# Patient Record
Sex: Female | Born: 1940 | Race: White | Hispanic: No | Smoking: Never smoker
Health system: Southern US, Community
[De-identification: ages and names within clinical notes are randomized; demographics above are authoritative.]

## PROBLEM LIST (undated history)

## (undated) DIAGNOSIS — I639 Cerebral infarction, unspecified: Secondary | ICD-10-CM

## (undated) DIAGNOSIS — R519 Headache, unspecified: Secondary | ICD-10-CM

## (undated) DIAGNOSIS — I219 Acute myocardial infarction, unspecified: Secondary | ICD-10-CM

## (undated) DIAGNOSIS — M1990 Unspecified osteoarthritis, unspecified site: Secondary | ICD-10-CM

## (undated) DIAGNOSIS — I2510 Atherosclerotic heart disease of native coronary artery without angina pectoris: Secondary | ICD-10-CM

## (undated) DIAGNOSIS — E119 Type 2 diabetes mellitus without complications: Secondary | ICD-10-CM

## (undated) DIAGNOSIS — M1712 Unilateral primary osteoarthritis, left knee: Secondary | ICD-10-CM

## (undated) DIAGNOSIS — R51 Headache: Secondary | ICD-10-CM

## (undated) HISTORY — PX: BACK SURGERY: SHX140

## (undated) HISTORY — PX: WRIST SURGERY: SHX841

## (undated) HISTORY — PX: COLONOSCOPY: SHX5424

---

## 2009-03-09 ENCOUNTER — Encounter: Admission: RE | Admit: 2009-03-09 | Discharge: 2009-03-09 | Payer: Self-pay | Admitting: Neurosurgery

## 2011-03-29 ENCOUNTER — Other Ambulatory Visit: Payer: Self-pay | Admitting: Neurology

## 2011-03-29 DIAGNOSIS — S020XXA Fracture of vault of skull, initial encounter for closed fracture: Secondary | ICD-10-CM

## 2011-03-29 DIAGNOSIS — F0781 Postconcussional syndrome: Secondary | ICD-10-CM

## 2011-04-03 ENCOUNTER — Ambulatory Visit
Admission: RE | Admit: 2011-04-03 | Discharge: 2011-04-03 | Disposition: A | Discharge status: Home / Self Care | Payer: Medicare Other | Source: Ambulatory Visit | Attending: Neurology | Admitting: Neurology

## 2011-04-03 DIAGNOSIS — S020XXA Fracture of vault of skull, initial encounter for closed fracture: Secondary | ICD-10-CM

## 2011-04-03 DIAGNOSIS — F0781 Postconcussional syndrome: Secondary | ICD-10-CM

## 2011-04-03 MED ORDER — GADOBENATE DIMEGLUMINE 529 MG/ML IV SOLN
20.0000 mL | Freq: Once | INTRAVENOUS | Status: AC | PRN
  Administered 2011-04-03: 20 mL via INTRAVENOUS

## 2011-04-09 ENCOUNTER — Ambulatory Visit: Payer: Medicare Other | Attending: Neurology | Admitting: Rehabilitative and Restorative Service Providers"

## 2011-04-09 DIAGNOSIS — IMO0001 Reserved for inherently not codable concepts without codable children: Secondary | ICD-10-CM | POA: Insufficient documentation

## 2011-04-09 DIAGNOSIS — H8110 Benign paroxysmal vertigo, unspecified ear: Secondary | ICD-10-CM | POA: Insufficient documentation

## 2011-04-17 ENCOUNTER — Ambulatory Visit: Payer: Medicare Other | Admitting: Rehabilitative and Restorative Service Providers"

## 2011-04-20 ENCOUNTER — Encounter: Payer: Medicare Other | Admitting: Rehabilitative and Restorative Service Providers"

## 2011-04-24 ENCOUNTER — Encounter: Payer: Medicare Other | Admitting: Rehabilitative and Restorative Service Providers"

## 2011-04-25 ENCOUNTER — Ambulatory Visit: Payer: Medicare Other | Admitting: Physical Therapy

## 2011-04-27 ENCOUNTER — Encounter: Payer: Medicare Other | Admitting: Rehabilitative and Restorative Service Providers"

## 2011-05-02 ENCOUNTER — Encounter: Payer: Medicare Other | Admitting: Rehabilitative and Restorative Service Providers"

## 2011-05-04 ENCOUNTER — Encounter: Payer: Medicare Other | Admitting: Rehabilitative and Restorative Service Providers"

## 2011-05-07 ENCOUNTER — Ambulatory Visit: Payer: Medicare Other | Admitting: Physical Therapy

## 2011-05-11 ENCOUNTER — Ambulatory Visit: Payer: Medicare Other | Attending: Neurology | Admitting: Rehabilitative and Restorative Service Providers"

## 2011-05-11 DIAGNOSIS — IMO0001 Reserved for inherently not codable concepts without codable children: Secondary | ICD-10-CM | POA: Insufficient documentation

## 2011-05-11 DIAGNOSIS — H8110 Benign paroxysmal vertigo, unspecified ear: Secondary | ICD-10-CM | POA: Insufficient documentation

## 2014-01-08 HISTORY — PX: CARDIAC CATHETERIZATION: SHX172

## 2015-10-21 ENCOUNTER — Other Ambulatory Visit: Payer: Self-pay | Admitting: Orthopedic Surgery

## 2015-10-25 ENCOUNTER — Encounter (HOSPITAL_COMMUNITY)
Admission: RE | Admit: 2015-10-25 | Discharge: 2015-10-25 | Disposition: A | Discharge status: Home / Self Care | Payer: Medicare Other | Source: Ambulatory Visit | Attending: Orthopedic Surgery | Admitting: Orthopedic Surgery

## 2015-10-25 ENCOUNTER — Encounter (HOSPITAL_COMMUNITY): Payer: Self-pay | Admitting: *Deleted

## 2015-10-25 DIAGNOSIS — Z8673 Personal history of transient ischemic attack (TIA), and cerebral infarction without residual deficits: Secondary | ICD-10-CM | POA: Diagnosis not present

## 2015-10-25 DIAGNOSIS — Z01812 Encounter for preprocedural laboratory examination: Secondary | ICD-10-CM | POA: Diagnosis present

## 2015-10-25 DIAGNOSIS — E119 Type 2 diabetes mellitus without complications: Secondary | ICD-10-CM | POA: Diagnosis not present

## 2015-10-25 DIAGNOSIS — Z79899 Other long term (current) drug therapy: Secondary | ICD-10-CM | POA: Diagnosis not present

## 2015-10-25 DIAGNOSIS — I2510 Atherosclerotic heart disease of native coronary artery without angina pectoris: Secondary | ICD-10-CM | POA: Insufficient documentation

## 2015-10-25 DIAGNOSIS — Z7984 Long term (current) use of oral hypoglycemic drugs: Secondary | ICD-10-CM | POA: Insufficient documentation

## 2015-10-25 HISTORY — DX: Headache, unspecified: R51.9

## 2015-10-25 HISTORY — DX: Type 2 diabetes mellitus without complications: E11.9

## 2015-10-25 HISTORY — DX: Acute myocardial infarction, unspecified: I21.9

## 2015-10-25 HISTORY — DX: Unspecified osteoarthritis, unspecified site: M19.90

## 2015-10-25 HISTORY — DX: Headache: R51

## 2015-10-25 HISTORY — DX: Atherosclerotic heart disease of native coronary artery without angina pectoris: I25.10

## 2015-10-25 HISTORY — DX: Cerebral infarction, unspecified: I63.9

## 2015-10-25 LAB — BASIC METABOLIC PANEL
ANION GAP: 6 (ref 5–15)
BUN: 7 mg/dL (ref 6–20)
CHLORIDE: 106 mmol/L (ref 101–111)
CO2: 29 mmol/L (ref 22–32)
CREATININE: 0.73 mg/dL (ref 0.44–1.00)
Calcium: 9.1 mg/dL (ref 8.9–10.3)
GFR calc Af Amer: >60 mL/min (ref >60)
GFR calc non Af Amer: >60 mL/min (ref >60)
GLUCOSE: 206 mg/dL — AB (ref 65–99)
POTASSIUM: 3.6 mmol/L (ref 3.5–5.1)
Sodium: 141 mmol/L (ref 135–145)

## 2015-10-25 LAB — CBC
HCT: 37.8 % (ref 36.0–46.0)
Hemoglobin: 12.4 g/dL (ref 12.0–15.0)
MCH: 29.9 pg (ref 26.0–34.0)
MCHC: 32.8 g/dL (ref 30.0–36.0)
MCV: 91.1 fL (ref 78.0–100.0)
PLATELETS: 240 10*3/uL (ref 150–400)
RBC: 4.15 MIL/uL (ref 3.87–5.11)
RDW: 14.5 % (ref 11.5–15.5)
WBC: 6.3 10*3/uL (ref 4.0–10.5)

## 2015-10-25 LAB — SURGICAL PCR SCREEN
MRSA, PCR: NEGATIVE
STAPHYLOCOCCUS AUREUS: NEGATIVE

## 2015-10-25 LAB — GLUCOSE, CAPILLARY: GLUCOSE-CAPILLARY: 193 mg/dL — AB (ref 65–99)

## 2015-10-25 NOTE — Pre-Procedure Instructions (Signed)
Kimberly Miller  10/25/2015      [facility] [place], [place] - [address] [place] [address] Phone: [phone] Fax: [phone]    Your procedure is scheduled on Oct. 24  Report to [facility] Admitting at 850 A.M.  Call this number if you have problems the morning of surgery:  [phone]   Remember:  Do not eat food or drink liquids after midnight.  Take these medicines the morning of surgery with A SIP OF WATER tylenol if needed, carvedilol (Coreg)  Stop taking aspirin, BC's, Goody's, Herbal medications, Fish Oil, Vitamins, Ibuprofen, Advil, Motrin, Aleve Stop Plavix as directed by your Dr.    Yevonne Miller to Manage Your Diabetes Before and After Surgery  Why is it important to control my blood sugar before and after surgery? . Improving blood sugar levels before and after surgery helps healing and can limit problems. . A way of improving blood sugar control is eating a healthy diet by: o  Eating less sugar and carbohydrates o  Increasing activity/exercise o  Talking with your doctor about reaching your blood sugar goals . High blood sugars (greater than 180 mg/dL) can raise your risk of infections and slow your recovery, so you will need to focus on controlling your diabetes during the weeks before surgery. . Make sure that the doctor who takes care of your diabetes knows about your planned surgery including the date and location.  How do I manage my blood sugar before surgery? . Check your blood sugar at least 4 times a day, starting 2 days before surgery, to make sure that the level is not too high or low. o Check your blood sugar the morning of your surgery when you wake up and every 2 hours until you get to the Short Stay unit. . If your blood sugar is less than 70 mg/dL, you will need to treat for low blood sugar: o Do not take insulin. o Treat a low blood sugar (less than 70 mg/dL) with  cup of clear juice (cranberry or  apple), 4 glucose tablets, OR glucose gel. o Recheck blood sugar in 15 minutes after treatment (to make sure it is greater than 70 mg/dL). If your blood sugar is not greater than 70 mg/dL on recheck, call [phone] for further instructions. . Report your blood sugar to the short stay nurse when you get to Short Stay.  . If you are admitted to the hospital after surgery: o Your blood sugar will be checked by the staff and you will probably be given insulin after surgery (instead of oral diabetes medicines) to make sure you have good blood sugar levels. o The goal for blood sugar control after surgery is 80-180 mg/dL.      WHAT DO I DO ABOUT MY DIABETES MEDICATION?   Marland Kitchen Do not take oral diabetes medicines (pills) the morning of surgery. Metformin (Glucophage)  . THE NIGHT BEFORE SURGERY, take 15 units of  Lantus insulin.       Marland Kitchen HE MORNING OF SURGERY, take _____________ units of __________insulin.  . The day of surgery, do not take other diabetes injectables, including Byetta (exenatide), Bydureon (exenatide ER), Victoza (liraglutide), or Trulicity (dulaglutide).  . If your CBG is greater than 220 mg/dL, you may take  of your sliding scale (correction) dose of insulin.  Other Instructions:          Patient Signature:  Date:   Nurse Signature:  Date:  Reviewed and Endorsed by [facility] Patient Education Committee, August 2015  Do not wear jewelry, make-up or nail polish.  Do not wear lotions, powders, or perfumes, or deoderant.  Do not shave 48 hours prior to surgery.  Men may shave face and neck.  Do not bring valuables to the hospital.  [facility] is not responsible for any belongings or valuables.  Contacts, dentures or bridgework may not be worn into surgery.  Leave your suitcase in the car.  After surgery it may be brought to your room.  For patients admitted to the hospital, discharge time will be determined by your treatment team.  Patients discharged the day  of surgery will not be allowed to drive home.    Special instructions:  [facility] - Preparing for Surgery  Before surgery, you can play an important role.  Because skin is not sterile, your skin needs to be as free of germs as possible.  You can reduce the number of germs on you skin by washing with CHG (chlorahexidine gluconate) soap before surgery.  CHG is an antiseptic cleaner which kills germs and bonds with the skin to continue killing germs even after washing.  Please DO NOT use if you have an allergy to CHG or antibacterial soaps.  If your skin becomes reddened/irritated stop using the CHG and inform your nurse when you arrive at Short Stay.  Do not shave (including legs and underarms) for at least 48 hours prior to the first CHG shower.  You may shave your face.  Please follow these instructions carefully:   1.  Shower with CHG Soap the night before surgery and the    morning of Surgery.  2.  If you choose to wash your hair, wash your hair first as usual with your   normal shampoo.  3.  After you shampoo, rinse your hair and body thoroughly to remove the Shampoo.  4.  Use CHG as you would any other liquid soap.  You can apply chg directly  to the skin and wash gently with scrungie or a clean washcloth.  5.  Apply the CHG Soap to your body ONLY FROM THE NECK DOWN.        Do not use on open wounds or open sores.  Avoid contact with your eyes,       ears, mouth and genitals (private parts).  Wash genitals (private parts)  with your normal soap.  6.  Wash thoroughly, paying special attention to the area where your surgery   will be performed.  7.  Thoroughly rinse your body with warm water from the neck down.  8.  DO NOT shower/wash with your normal soap after using and rinsing off   the CHG Soap.  9.  Pat yourself dry with a clean towel.            10.  Wear clean pajamas.            11.  Place clean sheets on your bed the night of your first shower and do not sleep with pets.  Day of  Surgery  Do not apply any lotions/deoderants the morning of surgery.  Please wear clean clothes to the hospital/surgery center.     Please read over the following fact sheets that you were given. Pain Booklet, Coughing and Deep Breathing, MRSA Information and Surgical Site Infection Prevention

## 2015-10-25 NOTE — Progress Notes (Signed)
PCP is Dr. Salvatore DecentPaul Settle Cardiologist is Dr. Wilhemina BonitoSteven Davis [facility] Denies chest pain Request sent for EKG tracing to Dr Jinny Sandersavis's office. Pt request to go back to [facility] after surgery, Encouraged her to speak with the social worker about this- voices understanding.  Reports her fasting CBg's run 70-80's. Dr Shelba FlakeLandau's office called and asked when she should stop taking her plavix-message left with Sherri.

## 2015-10-25 NOTE — Progress Notes (Addendum)
Anesthesia Chart Review:  Pt is a 75 year old female scheduled for L unicompartmental knee on 11/01/2015 with Teryl LucyJoshua Landau, MD.   - Cardiologist [place] Wilhemina BonitoSteven Davis[place] - PCP is Salvatore DecentPaul Settle, MD.   PMH includes:  CAD (MI, stent to LAD 04/2014), stroke, DM. Never smoker.  Medications include: Carvedilol, Plavix, Lantus, lisinopril, metformin. As of the morning of 11/01/15, no one had addressed stopping plavix with pt.  Sherri in Dr. Shelba FlakeLandau's office to get input from cardiologist's office and notify pt when to stop plavix.   Preoperative labs reviewed.  Glucose 206. HbgA1c 6.9.  EKG requested from cardiologist's office.   Nuclear stress test 10/11/15: There is a large fixed apical defect. No ischemia. EF probably mildly reduced. Calculated EF is 54%. EF visually appears lower.  Dr. Earlene Plateravis' note dated 09/28/15 states if stress test is normal (see above), he will clear pt for surgery.   If no changes, I anticipate pt can proceed with surgery as scheduled.   Rica Mastngela Julann Mcgilvray, FNP-BC [facility]/Anesthesiology Phone: [phone] 10/26/2015 1:07 PM

## 2015-10-26 LAB — HEMOGLOBIN A1C
HEMOGLOBIN A1C: 6.9 % — AB (ref 4.8–5.6)
MEAN PLASMA GLUCOSE: 151 mg/dL

## 2015-11-01 ENCOUNTER — Inpatient Hospital Stay (HOSPITAL_COMMUNITY)
Admission: RE | Admit: 2015-11-01 | Discharge: 2015-11-04 | DRG: 470 | Disposition: A | Discharge status: Skilled Nursing Facility | Payer: Medicare Other | Source: Ambulatory Visit | Attending: Orthopedic Surgery | Admitting: Orthopedic Surgery

## 2015-11-01 ENCOUNTER — Inpatient Hospital Stay (HOSPITAL_COMMUNITY): Payer: Medicare Other | Admitting: Anesthesiology

## 2015-11-01 ENCOUNTER — Encounter (HOSPITAL_COMMUNITY)
Admission: RE | Disposition: A | Discharge status: Skilled Nursing Facility | Payer: Self-pay | Source: Ambulatory Visit | Attending: Orthopedic Surgery

## 2015-11-01 ENCOUNTER — Inpatient Hospital Stay (HOSPITAL_COMMUNITY): Payer: Medicare Other | Admitting: Emergency Medicine

## 2015-11-01 ENCOUNTER — Encounter (HOSPITAL_COMMUNITY): Payer: Self-pay | Admitting: *Deleted

## 2015-11-01 ENCOUNTER — Inpatient Hospital Stay (HOSPITAL_COMMUNITY): Payer: Medicare Other

## 2015-11-01 DIAGNOSIS — I10 Essential (primary) hypertension: Secondary | ICD-10-CM | POA: Diagnosis present

## 2015-11-01 DIAGNOSIS — M1712 Unilateral primary osteoarthritis, left knee: Principal | ICD-10-CM | POA: Diagnosis present

## 2015-11-01 DIAGNOSIS — Z7984 Long term (current) use of oral hypoglycemic drugs: Secondary | ICD-10-CM | POA: Diagnosis not present

## 2015-11-01 DIAGNOSIS — Z833 Family history of diabetes mellitus: Secondary | ICD-10-CM | POA: Diagnosis not present

## 2015-11-01 DIAGNOSIS — E119 Type 2 diabetes mellitus without complications: Secondary | ICD-10-CM | POA: Diagnosis present

## 2015-11-01 DIAGNOSIS — Z79899 Other long term (current) drug therapy: Secondary | ICD-10-CM | POA: Diagnosis not present

## 2015-11-01 DIAGNOSIS — Z886 Allergy status to analgesic agent status: Secondary | ICD-10-CM | POA: Diagnosis not present

## 2015-11-01 DIAGNOSIS — Z8673 Personal history of transient ischemic attack (TIA), and cerebral infarction without residual deficits: Secondary | ICD-10-CM

## 2015-11-01 DIAGNOSIS — Z888 Allergy status to other drugs, medicaments and biological substances status: Secondary | ICD-10-CM

## 2015-11-01 DIAGNOSIS — Z794 Long term (current) use of insulin: Secondary | ICD-10-CM | POA: Diagnosis not present

## 2015-11-01 DIAGNOSIS — I2510 Atherosclerotic heart disease of native coronary artery without angina pectoris: Secondary | ICD-10-CM | POA: Diagnosis present

## 2015-11-01 DIAGNOSIS — Z823 Family history of stroke: Secondary | ICD-10-CM | POA: Diagnosis not present

## 2015-11-01 DIAGNOSIS — Z96659 Presence of unspecified artificial knee joint: Secondary | ICD-10-CM

## 2015-11-01 DIAGNOSIS — Z7902 Long term (current) use of antithrombotics/antiplatelets: Secondary | ICD-10-CM | POA: Diagnosis not present

## 2015-11-01 HISTORY — PX: TOTAL KNEE ARTHROPLASTY: SHX125

## 2015-11-01 HISTORY — DX: Unilateral primary osteoarthritis, left knee: M17.12

## 2015-11-01 LAB — GLUCOSE, CAPILLARY
GLUCOSE-CAPILLARY: 120 mg/dL — AB (ref 65–99)
Glucose-Capillary: 148 mg/dL — ABNORMAL HIGH (ref 65–99)
Glucose-Capillary: 132 mg/dL — ABNORMAL HIGH (ref 65–99)

## 2015-11-01 SURGERY — ARTHROPLASTY, KNEE, TOTAL
Anesthesia: Regional | Site: Knee | Laterality: Left

## 2015-11-01 MED ORDER — MAGNESIUM CITRATE PO SOLN: 1.0000 | Freq: Once | ORAL | Status: DC | PRN

## 2015-11-01 MED ORDER — CARVEDILOL 12.5 MG PO TABS: 12.5000 mg | ORAL_TABLET | Freq: Every day | ORAL | Status: DC

## 2015-11-01 MED ORDER — PHENYLEPHRINE HCL 10 MG/ML IJ SOLN
INTRAMUSCULAR | Status: DC | PRN
  Administered 2015-11-01 (×3): 80 ug via INTRAVENOUS

## 2015-11-01 MED ORDER — CYANOCOBALAMIN 500 MCG PO TABS
250.0000 ug | ORAL_TABLET | Freq: Every day | ORAL | Status: DC
  Administered 2015-11-02 – 2015-11-03 (×2): 250 ug via ORAL
  Filled 2015-11-01 (×3): qty 1

## 2015-11-01 MED ORDER — LISINOPRIL 5 MG PO TABS
5.0000 mg | ORAL_TABLET | Freq: Every day | ORAL | Status: DC
  Administered 2015-11-02 – 2015-11-04 (×3): 5 mg via ORAL
  Filled 2015-11-01 (×3): qty 1

## 2015-11-01 MED ORDER — BUPIVACAINE HCL (PF) 0.25 % IJ SOLN
INTRAMUSCULAR | Status: DC | PRN
  Administered 2015-11-01: 20 mL

## 2015-11-01 MED ORDER — HYDROMORPHONE HCL 1 MG/ML IJ SOLN
INTRAMUSCULAR | Status: AC
  Filled 2015-11-01: qty 0.5

## 2015-11-01 MED ORDER — B-12 5000 MCG PO CAPS: 1.0000 | ORAL_CAPSULE | Freq: Every day | ORAL | Status: DC

## 2015-11-01 MED ORDER — LACTATED RINGERS IV SOLN
INTRAVENOUS | Status: DC
  Administered 2015-11-01 (×3): via INTRAVENOUS

## 2015-11-01 MED ORDER — METOCLOPRAMIDE HCL 5 MG/ML IJ SOLN: 5.0000 mg | Freq: Three times a day (TID) | INTRAMUSCULAR | Status: DC | PRN

## 2015-11-01 MED ORDER — SENNA 8.6 MG PO TABS
1.0000 | ORAL_TABLET | Freq: Two times a day (BID) | ORAL | Status: DC
  Administered 2015-11-01 – 2015-11-04 (×6): 8.6 mg via ORAL
  Filled 2015-11-01 (×5): qty 1

## 2015-11-01 MED ORDER — FENTANYL CITRATE (PF) 100 MCG/2ML IJ SOLN
INTRAMUSCULAR | Status: AC
  Filled 2015-11-01: qty 2

## 2015-11-01 MED ORDER — DOCUSATE SODIUM 100 MG PO CAPS
100.0000 mg | ORAL_CAPSULE | Freq: Two times a day (BID) | ORAL | Status: DC
  Administered 2015-11-01 – 2015-11-04 (×6): 100 mg via ORAL
  Filled 2015-11-01 (×6): qty 1

## 2015-11-01 MED ORDER — ACETAMINOPHEN 325 MG PO TABS
650.0000 mg | ORAL_TABLET | Freq: Four times a day (QID) | ORAL | Status: DC | PRN
  Administered 2015-11-03 (×2): 650 mg via ORAL
  Filled 2015-11-01 (×2): qty 2

## 2015-11-01 MED ORDER — ONDANSETRON HCL 4 MG/2ML IJ SOLN: 4.0000 mg | Freq: Four times a day (QID) | INTRAMUSCULAR | Status: DC | PRN

## 2015-11-01 MED ORDER — MIDAZOLAM HCL 5 MG/5ML IJ SOLN
INTRAMUSCULAR | Status: DC | PRN
Start: 2015-11-01 — End: 2015-11-01
  Administered 2015-11-01: 1 mg via INTRAVENOUS

## 2015-11-01 MED ORDER — MIDAZOLAM HCL 2 MG/2ML IJ SOLN
0.5000 mg | Freq: Once | INTRAMUSCULAR | Status: AC
  Administered 2015-11-01: 0.5 mg via INTRAVENOUS
  Filled 2015-11-01: qty 0.5

## 2015-11-01 MED ORDER — MIDAZOLAM HCL 2 MG/2ML IJ SOLN
INTRAMUSCULAR | Status: AC
  Filled 2015-11-01: qty 2

## 2015-11-01 MED ORDER — OXYCODONE HCL 5 MG PO TABS
ORAL_TABLET | ORAL | Status: AC
  Filled 2015-11-01: qty 1

## 2015-11-01 MED ORDER — RIVAROXABAN 10 MG PO TABS: 10.0000 mg | ORAL_TABLET | Freq: Every day | ORAL | 0 refills | Status: DC

## 2015-11-01 MED ORDER — PROPOFOL 10 MG/ML IV BOLUS
INTRAVENOUS | Status: DC | PRN
  Administered 2015-11-01: 20 mg via INTRAVENOUS
  Administered 2015-11-01: 100 mg via INTRAVENOUS

## 2015-11-01 MED ORDER — METHOCARBAMOL 500 MG PO TABS
500.0000 mg | ORAL_TABLET | Freq: Four times a day (QID) | ORAL | Status: DC | PRN
  Administered 2015-11-01 – 2015-11-02 (×3): 500 mg via ORAL
  Filled 2015-11-01 (×2): qty 1

## 2015-11-01 MED ORDER — HYDROMORPHONE HCL 2 MG/ML IJ SOLN
INTRAMUSCULAR | Status: AC
  Filled 2015-11-01: qty 1

## 2015-11-01 MED ORDER — SODIUM CHLORIDE 0.9 % IR SOLN
Status: DC | PRN
  Administered 2015-11-01: 3000 mL

## 2015-11-01 MED ORDER — DIPHENHYDRAMINE HCL 12.5 MG/5ML PO ELIX: 12.5000 mg | ORAL_SOLUTION | ORAL | Status: DC | PRN

## 2015-11-01 MED ORDER — ONDANSETRON HCL 4 MG PO TABS: 4.0000 mg | ORAL_TABLET | Freq: Four times a day (QID) | ORAL | Status: DC | PRN

## 2015-11-01 MED ORDER — PHENOL 1.4 % MT LIQD: 1.0000 | OROMUCOSAL | Status: DC | PRN

## 2015-11-01 MED ORDER — FENTANYL CITRATE (PF) 100 MCG/2ML IJ SOLN
25.0000 ug | Freq: Once | INTRAMUSCULAR | Status: AC
  Administered 2015-11-01: 25 ug via INTRAVENOUS
  Filled 2015-11-01: qty 0.5

## 2015-11-01 MED ORDER — BUPIVACAINE IN DEXTROSE 0.75-8.25 % IT SOLN
INTRATHECAL | Status: DC | PRN
  Administered 2015-11-01: 1.8 mL via INTRATHECAL

## 2015-11-01 MED ORDER — CARVEDILOL 12.5 MG PO TABS
12.5000 mg | ORAL_TABLET | Freq: Every day | ORAL | Status: DC
  Administered 2015-11-02 – 2015-11-04 (×3): 12.5 mg via ORAL
  Filled 2015-11-01 (×3): qty 1

## 2015-11-01 MED ORDER — OXYCODONE HCL 5 MG PO TABS
5.0000 mg | ORAL_TABLET | ORAL | Status: DC | PRN
  Administered 2015-11-01 (×2): 10 mg via ORAL
  Administered 2015-11-01: 5 mg via ORAL
  Administered 2015-11-02 – 2015-11-04 (×7): 10 mg via ORAL
  Filled 2015-11-01 (×11): qty 2

## 2015-11-01 MED ORDER — HYDROMORPHONE HCL 2 MG/ML IJ SOLN
0.5000 mg | INTRAMUSCULAR | Status: DC | PRN
  Administered 2015-11-01: 0.5 mg via INTRAVENOUS
  Filled 2015-11-01: qty 1

## 2015-11-01 MED ORDER — ONDANSETRON HCL 4 MG PO TABS: 4.0000 mg | ORAL_TABLET | Freq: Three times a day (TID) | ORAL | 0 refills | Status: DC | PRN

## 2015-11-01 MED ORDER — POLYETHYLENE GLYCOL 3350 17 G PO PACK: 17.0000 g | PACK | Freq: Every day | ORAL | Status: DC | PRN

## 2015-11-01 MED ORDER — INSULIN ASPART 100 UNIT/ML ~~LOC~~ SOLN
0.0000 [IU] | Freq: Three times a day (TID) | SUBCUTANEOUS | Status: DC
  Administered 2015-11-01: 2 [IU] via SUBCUTANEOUS
  Administered 2015-11-02: 3 [IU] via SUBCUTANEOUS
  Administered 2015-11-02: 8 [IU] via SUBCUTANEOUS
  Administered 2015-11-02: 3 [IU] via SUBCUTANEOUS
  Administered 2015-11-03 (×2): 2 [IU] via SUBCUTANEOUS
  Administered 2015-11-03: 3 [IU] via SUBCUTANEOUS
  Administered 2015-11-03: 8 [IU] via SUBCUTANEOUS
  Administered 2015-11-04: 2 [IU] via SUBCUTANEOUS
  Administered 2015-11-04: 3 [IU] via SUBCUTANEOUS

## 2015-11-01 MED ORDER — CARVEDILOL 12.5 MG PO TABS
12.5000 mg | ORAL_TABLET | Freq: Once | ORAL | Status: AC
  Administered 2015-11-01: 12.5 mg via ORAL
  Filled 2015-11-01: qty 1

## 2015-11-01 MED ORDER — METHOCARBAMOL 1000 MG/10ML IJ SOLN
500.0000 mg | Freq: Four times a day (QID) | INTRAVENOUS | Status: DC | PRN
  Filled 2015-11-01: qty 5

## 2015-11-01 MED ORDER — VITAMIN D 1000 UNITS PO TABS
5000.0000 [IU] | ORAL_TABLET | Freq: Every day | ORAL | Status: DC
  Administered 2015-11-01 – 2015-11-04 (×4): 5000 [IU] via ORAL
  Filled 2015-11-01 (×7): qty 5

## 2015-11-01 MED ORDER — POTASSIUM CHLORIDE IN NACL 20-0.45 MEQ/L-% IV SOLN
INTRAVENOUS | Status: DC
  Filled 2015-11-01 (×2): qty 1000

## 2015-11-01 MED ORDER — CEFAZOLIN SODIUM-DEXTROSE 2-4 GM/100ML-% IV SOLN
2.0000 g | INTRAVENOUS | Status: AC
  Administered 2015-11-01: 2 g via INTRAVENOUS
  Filled 2015-11-01: qty 100

## 2015-11-01 MED ORDER — SENNA-DOCUSATE SODIUM 8.6-50 MG PO TABS: 2.0000 | ORAL_TABLET | Freq: Every day | ORAL | 1 refills | Status: AC

## 2015-11-01 MED ORDER — INSULIN GLARGINE 100 UNIT/ML ~~LOC~~ SOLN
30.0000 [IU] | Freq: Every day | SUBCUTANEOUS | Status: DC
  Administered 2015-11-01 – 2015-11-03 (×3): 30 [IU] via SUBCUTANEOUS
  Filled 2015-11-01 (×4): qty 0.3

## 2015-11-01 MED ORDER — METHOCARBAMOL 500 MG PO TABS
ORAL_TABLET | ORAL | Status: AC
  Filled 2015-11-01: qty 1

## 2015-11-01 MED ORDER — HYDROMORPHONE HCL 1 MG/ML IJ SOLN
0.2500 mg | INTRAMUSCULAR | Status: DC | PRN
  Administered 2015-11-01 (×4): 0.5 mg via INTRAVENOUS

## 2015-11-01 MED ORDER — FENTANYL CITRATE (PF) 100 MCG/2ML IJ SOLN
INTRAMUSCULAR | Status: DC | PRN
  Administered 2015-11-01: 50 ug via INTRAVENOUS
  Administered 2015-11-01: 25 ug via INTRAVENOUS
  Administered 2015-11-01 (×2): 50 ug via INTRAVENOUS
  Administered 2015-11-01: 25 ug via INTRAVENOUS

## 2015-11-01 MED ORDER — ROPIVACAINE HCL 7.5 MG/ML IJ SOLN
INTRAMUSCULAR | Status: DC | PRN
  Administered 2015-11-01: 15 mL via PERINEURAL

## 2015-11-01 MED ORDER — DEXAMETHASONE SODIUM PHOSPHATE 10 MG/ML IJ SOLN
10.0000 mg | Freq: Once | INTRAMUSCULAR | Status: AC
  Administered 2015-11-02: 10 mg via INTRAVENOUS
  Filled 2015-11-01: qty 1

## 2015-11-01 MED ORDER — PROPOFOL 500 MG/50ML IV EMUL
INTRAVENOUS | Status: DC | PRN
  Administered 2015-11-01: 50 ug/kg/min via INTRAVENOUS

## 2015-11-01 MED ORDER — METOCLOPRAMIDE HCL 5 MG PO TABS: 5.0000 mg | ORAL_TABLET | Freq: Three times a day (TID) | ORAL | Status: DC | PRN

## 2015-11-01 MED ORDER — PROPOFOL 10 MG/ML IV BOLUS
INTRAVENOUS | Status: AC
Start: 2015-11-01 — End: 2015-11-01
  Filled 2015-11-01: qty 20

## 2015-11-01 MED ORDER — BISACODYL 10 MG RE SUPP: 10.0000 mg | Freq: Every day | RECTAL | Status: DC | PRN

## 2015-11-01 MED ORDER — CEFAZOLIN SODIUM-DEXTROSE 2-4 GM/100ML-% IV SOLN
2.0000 g | Freq: Four times a day (QID) | INTRAVENOUS | Status: AC
Start: 2015-11-01 — End: 2015-11-02
  Administered 2015-11-01 (×2): 2 g via INTRAVENOUS
  Filled 2015-11-01 (×3): qty 100

## 2015-11-01 MED ORDER — RIVAROXABAN 10 MG PO TABS
10.0000 mg | ORAL_TABLET | Freq: Every day | ORAL | Status: DC
  Administered 2015-11-02 – 2015-11-04 (×3): 10 mg via ORAL
  Filled 2015-11-01 (×3): qty 1

## 2015-11-01 MED ORDER — CARVEDILOL 12.5 MG PO TABS
ORAL_TABLET | ORAL | Status: AC
  Filled 2015-11-01: qty 1

## 2015-11-01 MED ORDER — OXYCODONE-ACETAMINOPHEN 5-325 MG PO TABS: 1.0000 | ORAL_TABLET | Freq: Four times a day (QID) | ORAL | 0 refills | Status: DC | PRN

## 2015-11-01 MED ORDER — GABAPENTIN 300 MG PO CAPS
900.0000 mg | ORAL_CAPSULE | Freq: Every day | ORAL | Status: DC
  Administered 2015-11-01 – 2015-11-03 (×3): 900 mg via ORAL
  Filled 2015-11-01 (×3): qty 3

## 2015-11-01 MED ORDER — MENTHOL 3 MG MT LOZG: 1.0000 | LOZENGE | OROMUCOSAL | Status: DC | PRN

## 2015-11-01 MED ORDER — ALUM & MAG HYDROXIDE-SIMETH 200-200-20 MG/5ML PO SUSP: 30.0000 mL | ORAL | Status: DC | PRN

## 2015-11-01 MED ORDER — 0.9 % SODIUM CHLORIDE (POUR BTL) OPTIME
TOPICAL | Status: DC | PRN
  Administered 2015-11-01: 1000 mL

## 2015-11-01 MED ORDER — ACETAMINOPHEN 650 MG RE SUPP: 650.0000 mg | Freq: Four times a day (QID) | RECTAL | Status: DC | PRN

## 2015-11-01 MED ORDER — BUPIVACAINE HCL (PF) 0.25 % IJ SOLN
INTRAMUSCULAR | Status: AC
  Filled 2015-11-01: qty 30

## 2015-11-01 MED ORDER — BACLOFEN 10 MG PO TABS
10.0000 mg | ORAL_TABLET | Freq: Three times a day (TID) | ORAL | 0 refills | Status: AC
Start: 2015-11-01 — End: ?

## 2015-11-01 MED ORDER — EPHEDRINE SULFATE 50 MG/ML IJ SOLN
INTRAMUSCULAR | Status: DC | PRN
  Administered 2015-11-01: 5 mg via INTRAVENOUS
  Administered 2015-11-01 (×2): 10 mg via INTRAVENOUS

## 2015-11-01 SURGICAL SUPPLY — 68 items
BANDAGE ACE 6X5 VEL STRL LF (GAUZE/BANDAGES/DRESSINGS) ×3 IMPLANT
BANDAGE ESMARK 6X9 LF (GAUZE/BANDAGES/DRESSINGS) ×2 IMPLANT
BLADE SAG 18X100X1.27 (BLADE) ×3 IMPLANT
BNDG ESMARK 6X9 LF (GAUZE/BANDAGES/DRESSINGS) ×3
BOWL SMART MIX CTS (DISPOSABLE) ×3 IMPLANT
CAPT KNEE TOTAL 3 ×3 IMPLANT
CEMENT HV SMART SET (Cement) ×6 IMPLANT
CLSR STERI-STRIP ANTIMIC 1/2X4 (GAUZE/BANDAGES/DRESSINGS) ×3 IMPLANT
COVER SURGICAL LIGHT HANDLE (MISCELLANEOUS) ×3 IMPLANT
CUFF TOURNIQUET SINGLE 34IN LL (TOURNIQUET CUFF) ×3 IMPLANT
DECANTER SPIKE VIAL GLASS SM (MISCELLANEOUS) ×3 IMPLANT
DRAPE EXTREMITY T 121X128X90 (DRAPE) ×3 IMPLANT
DRAPE IMP U-DRAPE 54X76 (DRAPES) ×3 IMPLANT
DRAPE ORTHO SPLIT 77X108 STRL (DRAPES)
DRAPE PROXIMA HALF (DRAPES) IMPLANT
DRAPE SURG ORHT 6 SPLT 77X108 (DRAPES) IMPLANT
DRAPE U-SHAPE 47X51 STRL (DRAPES) ×3 IMPLANT
DRSG PAD ABDOMINAL 8X10 ST (GAUZE/BANDAGES/DRESSINGS) ×3 IMPLANT
DURAPREP 26ML APPLICATOR (WOUND CARE) ×3 IMPLANT
ELECT CAUTERY BLADE 6.4 (BLADE) ×3 IMPLANT
ELECT REM PT RETURN 9FT ADLT (ELECTROSURGICAL) ×3
ELECTRODE REM PT RTRN 9FT ADLT (ELECTROSURGICAL) ×2 IMPLANT
GAUZE SPONGE 4X4 12PLY STRL (GAUZE/BANDAGES/DRESSINGS) ×3 IMPLANT
GLOVE BIO SURGEON STRL SZ7.5 (GLOVE) ×3 IMPLANT
GLOVE BIOGEL PI IND STRL 8 (GLOVE) ×2 IMPLANT
GLOVE BIOGEL PI INDICATOR 8 (GLOVE) ×1
GLOVE BIOGEL PI ORTHO PRO SZ8 (GLOVE) ×1
GLOVE PI ORTHO PRO STRL SZ8 (GLOVE) ×2 IMPLANT
GLOVE SURG ORTHO 8.0 STRL STRW (GLOVE) ×3 IMPLANT
GOWN STRL REUS W/ TWL LRG LVL3 (GOWN DISPOSABLE) ×2 IMPLANT
GOWN STRL REUS W/ TWL XL LVL3 (GOWN DISPOSABLE) ×2 IMPLANT
GOWN STRL REUS W/TWL 2XL LVL3 (GOWN DISPOSABLE) ×3 IMPLANT
GOWN STRL REUS W/TWL LRG LVL3 (GOWN DISPOSABLE) ×1
GOWN STRL REUS W/TWL XL LVL3 (GOWN DISPOSABLE) ×1
HANDPIECE INTERPULSE COAX TIP (DISPOSABLE) ×1
HOOD PEEL AWAY FACE SHEILD DIS (HOOD) ×9 IMPLANT
IMMOBILIZER KNEE 22 (SOFTGOODS) ×3 IMPLANT
IMMOBILIZER KNEE 22 UNIV (SOFTGOODS) ×3 IMPLANT
KIT BASIN OR (CUSTOM PROCEDURE TRAY) ×3 IMPLANT
KIT ROOM TURNOVER OR (KITS) ×3 IMPLANT
MANIFOLD NEPTUNE II (INSTRUMENTS) ×3 IMPLANT
NEEDLE 18GX1X1/2 (RX/OR ONLY) (NEEDLE) ×3 IMPLANT
NEEDLE HYPO 21X1.5 SAFETY (NEEDLE) IMPLANT
NS IRRIG 1000ML POUR BTL (IV SOLUTION) ×3 IMPLANT
PACK TOTAL JOINT (CUSTOM PROCEDURE TRAY) ×3 IMPLANT
PACK UNIVERSAL I (CUSTOM PROCEDURE TRAY) ×3 IMPLANT
PAD ABD 8X10 STRL (GAUZE/BANDAGES/DRESSINGS) ×3 IMPLANT
PAD ARMBOARD 7.5X6 YLW CONV (MISCELLANEOUS) ×6 IMPLANT
PAD CAST 4YDX4 CTTN HI CHSV (CAST SUPPLIES) ×2 IMPLANT
PADDING CAST COTTON 4X4 STRL (CAST SUPPLIES) ×1
PADDING CAST COTTON 6X4 STRL (CAST SUPPLIES) ×3 IMPLANT
SET HNDPC FAN SPRY TIP SCT (DISPOSABLE) ×2 IMPLANT
SUCTION FRAZIER HANDLE 10FR (MISCELLANEOUS) ×1
SUCTION FRAZIER TIP 10 FR DISP (SUCTIONS) ×3 IMPLANT
SUCTION TUBE FRAZIER 10FR DISP (MISCELLANEOUS) ×2 IMPLANT
SUT MNCRL AB 4-0 PS2 18 (SUTURE) IMPLANT
SUT VIC AB 0 CT1 27 (SUTURE) ×1
SUT VIC AB 0 CT1 27XBRD ANBCTR (SUTURE) ×2 IMPLANT
SUT VIC AB 1 CT1 27 (SUTURE) ×1
SUT VIC AB 1 CT1 27XBRD ANBCTR (SUTURE) ×2 IMPLANT
SUT VIC AB 2-0 CT1 27 (SUTURE) ×1
SUT VIC AB 2-0 CT1 TAPERPNT 27 (SUTURE) ×2 IMPLANT
SUT VIC AB 3-0 SH 8-18 (SUTURE) ×3 IMPLANT
SYR CONTROL 10ML LL (SYRINGE) ×3 IMPLANT
TOWEL OR 17X24 6PK STRL BLUE (TOWEL DISPOSABLE) ×3 IMPLANT
TOWEL OR 17X26 10 PK STRL BLUE (TOWEL DISPOSABLE) ×3 IMPLANT
TRAY CATH 16FR W/PLASTIC CATH (SET/KITS/TRAYS/PACK) ×3 IMPLANT
WATER STERILE IRR 1000ML POUR (IV SOLUTION) ×3 IMPLANT

## 2015-11-01 NOTE — H&P (Signed)
PREOPERATIVE H&P  Chief Complaint: DJD LEFT KNEE  HPI: Kimberly Miller is a 75 y.o. female who presents for preoperative history and physical with a diagnosis of DJD LEFT KNEE. Symptoms are rated as moderate to severe, and have been worsening.  This is significantly impairing activities of daily living.  She has elected for surgical management.   She has failed injections, activity modification, anti-inflammatories, and assistive devices.  Preoperative X-rays demonstrate end stage degenerative changes with osteophyte formation, loss of joint space, subchondral sclerosis. She is also failed anti-inflammatories as well as tramadol, the anti-inflammatories cause hives. She can't take prednisone either.   Past Medical History:  Diagnosis Date  . Arthritis   . Coronary artery disease   . Diabetes mellitus without complication (HCC)   . Headache   . Myocardial infarction   . Stroke [facility](HCC)    Past Surgical History:  Procedure Laterality Date  . CARDIAC CATHETERIZATION  2016  . COLONOSCOPY    . WRIST SURGERY Left    Social History   Social History  . Marital status: Unknown    Spouse name: N/A  . Number of children: N/A  . Years of education: N/A   Social History Main Topics  . Smoking status: Never Smoker  . Smokeless tobacco: Never Used  . Alcohol use No  . Drug use: No  . Sexual activity: Not Asked   Other Topics Concern  . None   Social History Narrative  . None   Family History  Problem Relation Age of Onset  . Stroke Mother   . Stroke Father   . Diabetes Brother    Allergies  Allergen Reactions  . Daypro [Oxaprozin] Swelling and Rash    Face, mouth, tongue swelling  . Ibuprofen Swelling and Rash    Allergic to ALL anti-inflammatory medications, including excedrine, aleve  . Naproxen Sodium Swelling and Rash   Prior to Admission medications   Medication Sig Start Date End Date Taking? Authorizing Provider  acetaminophen (TYLENOL) 650 MG CR tablet Take 650 mg  by mouth every 8 (eight) hours as needed for pain.   Yes Historical Provider, MD  carvedilol (COREG) 12.5 MG tablet Take 12.5 mg by mouth daily.   Yes Historical Provider, MD  Cholecalciferol ([employer] D3) 5000 units capsule Take 5,000 Units by mouth daily.   Yes Historical Provider, MD  clopidogrel (PLAVIX) 75 MG tablet Take 75 mg by mouth daily.   Yes Historical Provider, MD  Cyanocobalamin (B-12) 5000 MCG CAPS Take 1 tablet by mouth daily.    Yes Historical Provider, MD  gabapentin (NEURONTIN) 300 MG capsule Take 900 mg by mouth at bedtime.   Yes Historical Provider, MD  Grape Seed Extract 100 MG CAPS Take 100 mg by mouth daily.   Yes Historical Provider, MD  insulin glargine (LANTUS) 100 UNIT/ML injection Inject 30 Units into the skin at bedtime.   Yes Historical Provider, MD  lisinopril (PRINIVIL,ZESTRIL) 5 MG tablet Take 5 mg by mouth daily.   Yes Historical Provider, MD  metFORMIN (GLUCOPHAGE) 500 MG tablet Take 500 mg by mouth every evening.   Yes Historical Provider, MD  OVER THE COUNTER MEDICATION Take 1 tablet by mouth daily. B Complex vitamin with Vitamin C and folic acid   Yes Historical Provider, MD     Positive ROS: All other systems have been reviewed and were otherwise negative with the exception of those mentioned in the HPI and as above.  Physical Exam: General: Alert, no acute distress Cardiovascular: No pedal  edema Respiratory: No cyanosis, no use of accessory musculature GI: No organomegaly, abdomen is soft and non-tender Skin: No lesions in the area of chief complaint Neurologic: Sensation intact distally Psychiatric: Patient is competent for consent with normal mood and affect Lymphatic: No axillary or cervical lymphadenopathy  MUSCULOSKELETAL: Left knee has a painful arc of motion from 0-115, negative Lachman, medial joint line tenderness, extensor mechanism intact.  Assessment: DJD LEFT KNEE   Plan: Plan for Procedure(s): UNICOMPARTMENTAL KNEE versus total knee  replacement  The risks benefits and alternatives were discussed with the patient including but not limited to the risks of nonoperative treatment, versus surgical intervention including infection, bleeding, nerve injury,  blood clots, cardiopulmonary complications, morbidity, mortality, among others, and they were willing to proceed. We will assess the patellofemoral and lateral compartments intraoperatively to determine if she is in fact a partial knee versus total knee replacement.  Eulas Post, MD Cell [phone]   11/01/2015 10:36 AM

## 2015-11-01 NOTE — OR Nursing (Signed)
1455: in&out cath=300cc cyu, per protocol

## 2015-11-01 NOTE — Anesthesia Preprocedure Evaluation (Addendum)
Anesthesia Evaluation  Patient identified by MRN, date of birth, ID band Patient awake    Reviewed: Allergy & Precautions, NPO status , Patient's Chart, lab work & pertinent test results  Airway Mallampati: II  TM Distance: >3 FB Neck ROM: Full    Dental  (+) Dental Advisory Given   Pulmonary neg pulmonary ROS,    breath sounds clear to auscultation       Cardiovascular hypertension, Pt. on medications and Pt. on home beta blockers + CAD, + Past MI and + Cardiac Stents   Rhythm:Regular Rate:Normal     Neuro/Psych CVA    GI/Hepatic negative GI ROS, Neg liver ROS,   Endo/Other  diabetes, Type 2, Insulin Dependent  Renal/GU negative Renal ROS     Musculoskeletal  (+) Arthritis ,   Abdominal   Peds  Hematology On plavix last dose >7days ago.   Anesthesia Other Findings   Reproductive/Obstetrics                            Lab Results  Component Value Date   WBC 6.3 10/25/2015   HGB 12.4 10/25/2015   HCT 37.8 10/25/2015   MCV 91.1 10/25/2015   PLT 240 10/25/2015   Lab Results  Component Value Date   CREATININE 0.73 10/25/2015   BUN 7 10/25/2015   NA 141 10/25/2015   K 3.6 10/25/2015   CL 106 10/25/2015   CO2 29 10/25/2015   No results found for: INR, PROTIME  Anesthesia Physical Anesthesia Plan  ASA: III  Anesthesia Plan: MAC, Regional and Spinal   Post-op Pain Management:  Regional for Post-op pain   Induction: Intravenous  Airway Management Planned: Natural Airway and Simple Face Mask  Additional Equipment:   Intra-op Plan:   Post-operative Plan:   Informed Consent: I have reviewed the patients History and Physical, chart, labs and discussed the procedure including the risks, benefits and alternatives for the proposed anesthesia with the patient or authorized representative who has indicated his/her understanding and acceptance.     Plan Discussed with:  CRNA  Anesthesia Plan Comments:        Anesthesia Quick Evaluation

## 2015-11-01 NOTE — Anesthesia Procedure Notes (Signed)
Procedure Name: LMA Insertion Date/Time: 11/01/2015 12:15 PM Performed by: Donette LarryMEYER, Shonice Wrisley E Pre-anesthesia Checklist: Patient identified, Emergency Drugs available, Suction available and Patient being monitored Patient Re-evaluated:Patient Re-evaluated prior to inductionOxygen Delivery Method: Circle System Utilized Preoxygenation: Pre-oxygenation with 100% oxygen Intubation Type: IV induction Ventilation: Mask ventilation without difficulty LMA: LMA inserted LMA Size: 4.0 Number of attempts: 1 Placement Confirmation: positive ETCO2 Tube secured with: Tape Dental Injury: Teeth and Oropharynx as per pre-operative assessment

## 2015-11-01 NOTE — Anesthesia Procedure Notes (Signed)
Spinal  Staffing Anesthesiologist: Marcene DuosFITZGERALD, Noa Galvao Performed: anesthesiologist  Preanesthetic Checklist Completed: patient identified, site marked, surgical consent, pre-op evaluation, timeout performed, IV checked, risks and benefits discussed and monitors and equipment checked Spinal Block Patient position: sitting Prep: site prepped and draped and DuraPrep Patient monitoring: heart rate, continuous pulse ox and blood pressure Approach: midline Location: L3-4 Injection technique: single-shot Needle Needle type: Pencan  Needle gauge: 24 G Needle length: 9 cm Assessment Events: failed spinal Additional Notes Multiple attempts at L4-5, L5-S1, with successful flow of CSF at L3-4 interspace. Unable to aspirate so needle rotated 90 degrees x3 without successful aspiration. Syringe taken off and CSF no longer free flowing. Spinal needle withdrawn slightly and CSF free flowing again. Unable to aspirate with syringe. Needle rotated 90 degreees and positive aspiration before injection. Upon completion of injection the syringe slipped off and approximately the last .2ml sprayed onto field and needle withdrawn. Pt prepped and draped and inadequate block noted. Converted to GA with LMA.

## 2015-11-01 NOTE — Anesthesia Postprocedure Evaluation (Signed)
Anesthesia Post Note  Patient: Kimberly Miller  Procedure(s) Performed: Procedure(s) (LRB): TOTAL KNEE ARTHROPLASTY (Left)  Patient location during evaluation: PACU Anesthesia Type: Spinal and MAC Level of consciousness: awake and alert Pain management: pain level controlled Vital Signs Assessment: post-procedure vital signs reviewed and stable Respiratory status: spontaneous breathing and respiratory function stable Cardiovascular status: blood pressure returned to baseline and stable Postop Assessment: spinal receding Anesthetic complications: no    Kimberly Miller

## 2015-11-01 NOTE — Anesthesia Procedure Notes (Signed)
Anesthesia Regional Block:  Adductor canal block  Pre-Anesthetic Checklist: ,, timeout performed, Correct Patient, Correct Site, Correct Laterality, Correct Procedure, Correct Position, site marked, Risks and benefits discussed,  Surgical consent,  Pre-op evaluation,  At surgeon's request and post-op pain management  Laterality: Left  Prep: chloraprep       Needles:  Injection technique: Single-shot  Needle Type: Echogenic Needle     Needle Length: 9cm 9 cm Needle Gauge: 21 and 21 G    Additional Needles:  Procedures: ultrasound guided (picture in chart) Adductor canal block Narrative:  Start time: 11/01/2015 10:07 AM End time: 11/01/2015 10:14 AM Injection made incrementally with aspirations every 5 mL.  Performed by: Personally  Anesthesiologist: Marcene DuosFITZGERALD, Rikita Grabert

## 2015-11-01 NOTE — Transfer of Care (Signed)
Immediate Anesthesia Transfer of Care Note  Patient: Kimberly Miller  Procedure(s) Performed: Procedure(s): TOTAL KNEE ARTHROPLASTY (Left)  Patient Location: PACU  Anesthesia Type:GA combined with regional for post-op pain  Level of Consciousness: awake and alert   Airway & Oxygen Therapy: Patient Spontanous Breathing and Patient connected to nasal cannula oxygen  Post-op Assessment: Report given to RN, Post -op Vital signs reviewed and stable and Patient moving all extremities X 4  Post vital signs: Reviewed and stable  Last Vitals:  Vitals:   11/01/15 1045 11/01/15 1510  BP:    Pulse: 72   Resp: 10   Temp:  36.1 C   HR 75, BP 146/86, RR 16, sats 94% Last Pain:  Vitals:   11/01/15 0929  TempSrc: Oral  PainSc:       Patients Stated Pain Goal: 2 (11/01/15 [id number])  Complications: No apparent anesthesia complications

## 2015-11-01 NOTE — Op Note (Signed)
DATE OF SURGERY:  11/01/2015 TIME: 2:24 PM  PATIENT NAME:  Kimberly Miller   AGE: 75 y.o.    PRE-OPERATIVE DIAGNOSIS:  Left knee osteoarthritis  POST-OPERATIVE DIAGNOSIS:  Same  PROCEDURE:  Procedure(s): Left TOTAL KNEE ARTHROPLASTY   SURGEON:  Eulas PostLANDAU,Emmamae Mcnamara P, MD   ASSISTANT:  Janace LittenBrandon Parry, OPA-C, present and scrubbed throughout the case, critical for assistance with exposure, retraction, instrumentation, and closure.   OPERATIVE IMPLANTS: Biomet Vanguard size 71 tibia, 75 femur, 10 mm polyethylene insert, 37 mm patella. I used a posterior stabilized fixed-bearing system with patellar lug fixation.   PREOPERATIVE INDICATIONS:  Kimberly Miller is a 75 y.o. year old female with end stage bone on bone degenerative arthritis of the knee who failed conservative treatment, including injections, antiinflammatories, activity modification, and assistive devices, and had significant impairment of their activities of daily living, and elected for Total Knee Arthroplasty.   The risks, benefits, and alternatives were discussed at length including but not limited to the risks of infection, bleeding, nerve injury, stiffness, blood clots, the need for revision surgery, cardiopulmonary complications, among others, and they were willing to proceed.  OPERATIVE FINDINGS AND UNIQUE ASPECTS OF THE CASE:  Her bone quality was extremely poor. The patellofemoral joint had end-stage grade 4 changes on the lateral femoral condyle as well as the lateral facet of the patella, precluding partial knee replacement. The medial compartment was completely eburnated as well. The lateral compartment was still moderately spared, but she also had a 10 flexion contracture.  I had to cut the tibia twice and also the femur twice. She was between a 71 and a 75 on the tibia, but I went with the 71 in order to position the component with the appropriate rotation, and she was flushed from front to back, although she had a posterior  medial exposed tibial bone, but the medial tibial condyle was still fairly sclerotic, and I did not want to weaken that by removing any of the plateau. The patella tracked well at completion of the case.  OPERATIVE DESCRIPTION:  The patient was brought to the operative room and placed in a supine position.  General anesthesia was administered.  Initially spinal was attempted but was inadequate.  IV antibiotics were given.  The lower extremity was prepped and draped in the usual sterile fashion.  Time out was performed.  The leg was elevated and exsanguinated and the tourniquet was inflated.  Anterior quadriceps tendon splitting approach was performed.  I was considering if she was a partial knee candidate, I had her positioned in the leg holder, and after exposure of the patellofemoral joint I abandoned the partial knee plan and converted to a total knee replacement, which we had already prepared for and discussed with the patient.   The patella was partially everted and osteophytes were removed.  During the majority of the case I did not in fact everted patella but rather slid laterally around the condyle. The anterior horn of the medial and lateral meniscus was removed.   The distal femur was opened with the drill and the intramedullary distal femoral cutting jig was utilized, set at 5 degrees resecting 10 mm off the distal femur.  Care was taken to protect the collateral ligaments.  Then the extramedullary tibial cutting jig was utilized making the appropriate cut using the anterior tibial crest as a reference building in appropriate posterior slope.  Care was taken during the cut to protect the medial and collateral ligaments.  The proximal tibia was removed  along with the posterior horns of the menisci.  The PCL was sacrificed.    The extensor gap was measured and was still too tight, and so I removed an additional 2 mm off the tibia and the femur, resulting in a gap that measured approximately  10mm.  the bone quality was extremely poor.  The distal femoral sizing jig was applied, taking care to avoid notching.  Then the 4-in-1 cutting jig was applied and the anterior and posterior femur was cut, along with the chamfer cuts.  All posterior osteophytes were removed.  The flexion gap was then measured and was symmetric with the extension gap.  I completed the distal femoral preparation using the appropriate jig to prepare the box.  The patella was then measured, and cut with the saw.  The thickness before the cut was 20 at the thickest, although it was fairly thin on the lateral patellar facet from chronic wear, and after the cut was 13.  The proximal tibia sized and prepared accordingly with the reamer and the punch, and then all components were trialed with the 10mm poly insert.  The knee was found to have excellent balance and full motion.    The above named components were then cemented into place and all excess cement was removed.  The real polyethylene implant was placed.  After the cement had cured I released the tourniquet and confirmed excellent hemostasis with no major posterior vessel injury.    The knee was easily taken through a range of motion and the patella tracked well and the knee irrigated copiously and the parapatellar and subcutaneous tissue closed with vicryl, and monocryl with steri strips for the skin.  The wounds were injected with marcaine, and dressed with sterile gauze and the patient was awakened and returned to the PACU in stable and satisfactory condition.  There were no complications.  Total tourniquet time was 110 minutes.

## 2015-11-02 ENCOUNTER — Encounter (HOSPITAL_COMMUNITY): Payer: Self-pay | Admitting: Orthopedic Surgery

## 2015-11-02 LAB — GLUCOSE, CAPILLARY
GLUCOSE-CAPILLARY: 159 mg/dL — AB (ref 65–99)
GLUCOSE-CAPILLARY: 169 mg/dL — AB (ref 65–99)
Glucose-Capillary: 279 mg/dL — ABNORMAL HIGH (ref 65–99)
Glucose-Capillary: 247 mg/dL — ABNORMAL HIGH (ref 65–99)

## 2015-11-02 LAB — BASIC METABOLIC PANEL
ANION GAP: 10 (ref 5–15)
BUN: 7 mg/dL (ref 6–20)
CO2: 24 mmol/L (ref 22–32)
Calcium: 8.5 mg/dL — ABNORMAL LOW (ref 8.9–10.3)
Chloride: 101 mmol/L (ref 101–111)
Creatinine, Ser: 0.7 mg/dL (ref 0.44–1.00)
GFR calc Af Amer: >60 mL/min (ref >60)
GLUCOSE: 162 mg/dL — AB (ref 65–99)
POTASSIUM: 3.7 mmol/L (ref 3.5–5.1)
Sodium: 135 mmol/L (ref 135–145)

## 2015-11-02 LAB — CBC
HEMATOCRIT: 33.6 % — AB (ref 36.0–46.0)
Hemoglobin: 10.9 g/dL — ABNORMAL LOW (ref 12.0–15.0)
MCH: 29.9 pg (ref 26.0–34.0)
MCHC: 32.4 g/dL (ref 30.0–36.0)
MCV: 92.1 fL (ref 78.0–100.0)
PLATELETS: 227 10*3/uL (ref 150–400)
RBC: 3.65 MIL/uL — AB (ref 3.87–5.11)
RDW: 14.4 % (ref 11.5–15.5)
WBC: 8.6 10*3/uL (ref 4.0–10.5)

## 2015-11-02 NOTE — Evaluation (Addendum)
Physical Therapy Evaluation Patient Details Name: Kimberly Miller MRN: [id number] DOB: 07/01/1940 Today's Date: 11/02/2015   History of Present Illness  Pt is a 75 y.o. female now s/p Lt THA. PMH: stroke, MI, dieabetes, CAD.   Clinical Impression  Pt is s/p TKA resulting in the deficits listed below (see PT Problem List). Pt unable to safely attempt ambulation, able to perform sit/stand transfer with max assist. Pt will benefit from skilled PT to increase their independence and safety with mobility to allow discharge to SNF for further rehabilitation following acute stay.       Follow Up Recommendations SNF;Supervision for mobility/OOB    Equipment Recommendations   (to be addressed at next venue)    Recommendations for Other Services       Precautions / Restrictions Precautions Precautions: Knee;Fall Precaution Booklet Issued: Yes (comment) Precaution Comments: HEP provided and knee extension precautions reviewed Restrictions Weight Bearing Restrictions: Yes LLE Weight Bearing: Weight bearing as tolerated      Mobility  Bed Mobility Overal bed mobility: Needs Assistance Bed Mobility: Supine to Sit     Supine to sit: HOB elevated;Mod assist     General bed mobility comments: pt using rail and HOB elevated, assist provided with LLE to come to sitting EOB.   Transfers Overall transfer level: Needs assistance Equipment used: Rolling walker (2 wheeled) Transfers: Sit to/from [employer] Sit to Stand: Max assist;From elevated surface Stand pivot transfers: Mod assist       General transfer comment: Pt requiring 2 attempts to stand and repeated cues needed for hand position. Once standing pt needing verbal and tactile cues for posture. For stand-pivot, pt wiggling feet, poor ability to unweight extremities to take steps. Chair brought up from behind to fully square up to chair prior to sitting. Pt with 3 losses of balance during session with assist to  recover.   Ambulation/Gait             General Gait Details: unable to safely attempt  Stairs            Wheelchair Mobility    Modified Rankin (Stroke Patients Only)       Balance Overall balance assessment: Needs assistance Sitting-balance support: Single extremity supported Sitting balance-Leahy Scale: Fair     Standing balance support: Bilateral upper extremity supported Standing balance-Leahy Scale: Poor Standing balance comment: using rw and multiple losses of balance.                              Pertinent Vitals/Pain Pain Assessment: 0-10 Pain Score: 7  Pain Location: Lt knee Pain Descriptors / Indicators: Aching Pain Intervention(s): Limited activity within patient's tolerance;Monitored during session    Home Living Family/patient expects to be discharged to:: Skilled nursing facility Living Arrangements: Spouse/significant other Available Help at Discharge: Family           Home Equipment: Dan Humphreys - 2 wheels;Walker - 4 wheels;Cane - single point      Prior Function Level of Independence: Independent with assistive device(s)         Comments: using SPC for ambulation     Hand Dominance        Extremity/Trunk Assessment   Upper Extremity Assessment: Generalized weakness           Lower Extremity Assessment: LLE deficits/detail   LLE Deficits / Details: pt unable to raise LLE without assistance     Communication   Communication: No difficulties  Cognition Arousal/Alertness: Awake/alert Behavior During Therapy: WFL for tasks assessed/performed Overall Cognitive Status: Within Functional Limits for tasks assessed                      General Comments      Exercises     Assessment/Plan    PT Assessment Patient needs continued PT services  PT Problem List Decreased strength;Decreased range of motion;Decreased activity tolerance;Decreased balance;Decreased mobility          PT Treatment  Interventions DME instruction;Gait training;Functional mobility training;Therapeutic activities;Therapeutic exercise;Patient/family education;Stair training    PT Goals (Current goals can be found in the Care Plan section)  Acute Rehab PT Goals Patient Stated Goal: move better again PT Goal Formulation: With patient Time For Goal Achievement: 11/16/15 Potential to Achieve Goals: Good    Frequency 7X/week   Barriers to discharge        Co-evaluation               End of Session Equipment Utilized During Treatment: Gait belt;Left knee immobilizer Activity Tolerance: Patient limited by fatigue Patient left: in chair;with call bell/phone within reach (in knee extension) Nurse Communication: Mobility status;Weight bearing status         Time: [id number] PT Time Calculation (min) (ACUTE ONLY): 40 min   Charges:   PT Evaluation $PT Eval Moderate Complexity: 1 Procedure PT Treatments $Therapeutic Activity: 23-37 mins   PT G Codes:        Christiane HaBenjamin J. Ray Glacken, PT, CSCS Pager [phone] Office [phone]  11/02/2015, 9:36 AM

## 2015-11-02 NOTE — Progress Notes (Signed)
Patient ID: Kimberly Miller, female   DOB: 1940/12/24, 75 y.o.   MRN: [id number]     Subjective:  Patient reports pain as mild to moderate.  Patient sitting up in the bed and in no acute distress. She denies any CP or SOB  Objective:   VITALS:   Vitals:   11/01/15 1747 11/01/15 2003 11/02/15 0012 11/02/15 0343  BP: (!) 152/65 (!) 154/79 (!) 171/93 (!) 169/72  Pulse: 72  88 75  Resp: 18 18 18 18   Temp: 97.7 F (36.5 C) 97.1 F (36.2 C) 98.2 F (36.8 C) 98 F (36.7 C)  TempSrc:  Oral Oral Oral  SpO2: 100% 100% 100% 100%  Weight:      Height:        ABD soft Sensation intact distally Dorsiflexion/Plantar flexion intact Incision: dressing C/D/I and no drainage Good foot and ankle motion  Lab Results  Component Value Date   WBC 6.3 10/25/2015   HGB 12.4 10/25/2015   HCT 37.8 10/25/2015   MCV 91.1 10/25/2015   PLT 240 10/25/2015   BMET    Component Value Date/Time   NA 141 10/25/2015 1248   K 3.6 10/25/2015 1248   CL 106 10/25/2015 1248   CO2 29 10/25/2015 1248   GLUCOSE 206 (H) 10/25/2015 1248   BUN 7 10/25/2015 1248   CREATININE 0.73 10/25/2015 1248   CALCIUM 9.1 10/25/2015 1248   GFRNONAA >60 10/25/2015 1248   GFRAA >60 10/25/2015 1248     Assessment/Plan: 1 Day Post-Op   Principal Problem:   Primary localized osteoarthritis of left knee Active Problems:   S/P total knee arthroplasty   Advance diet Up with therapy Plan for discharge tomorrow WBAT Dry dressing PRN    Haskel KhanDOUGLAS PARRY, BRANDON 11/02/2015, 7:46 AM  Discussed and agree with above.   Teryl LucyJoshua Sunnie Odden, MD Cell [phone]

## 2015-11-02 NOTE — Evaluation (Signed)
Occupational Therapy Evaluation Patient Details Name: Kimberly Miller MRN: [id number] DOB: [birth year] Today's Date: 11/02/2015    History of Present Illness Pt is a 75 y.o. female now s/p Lt THA. PMH: stroke, MI, dieabetes, CAD.    Clinical Impression   PTA Pt independent in ADL. Pt currently able to do seated or bed level ADL and max assist for LB bathing/dressing. AE education and practice provided. Pt with deficit listed below. Pt will benefit from skilled OT in the acute care setting prior to discharge to SNF.     Follow Up Recommendations  SNF;Supervision/Assistance - 24 hour    Equipment Recommendations  Other (comment) (TBD by next venue of care)    Recommendations for Other Services       Precautions / Restrictions Precautions Precautions: Knee;Fall Precaution Booklet Issued: Yes (comment) Precaution Comments: knee extentsion precautions reviewed with Pt Restrictions Weight Bearing Restrictions: Yes LLE Weight Bearing: Weight bearing as tolerated      Mobility Bed Mobility Overal bed mobility: Needs Assistance Bed Mobility: Sit to Supine       Sit to supine: HOB elevated;Max assist   General bed mobility comments: Pt using rail and HOB elevated, assist provided in verbal cues for sequencing and assist with LLE to raise into the bed  Transfers Overall transfer level: Needs assistance Equipment used: Rolling walker (2 wheeled) Transfers: Stand Pivot Transfers;Sit to/from Stand Sit to Stand: Max assist Stand pivot transfers: Max assist       General transfer comment: Pt requiring max assist for sit to stand, and stand pivot with vc for sequencing and safety.     Balance Overall balance assessment: Needs assistance Sitting-balance support: Single extremity supported;Feet supported Sitting balance-Leahy Scale: Fair     Standing balance support: Bilateral upper extremity supported;During functional activity Standing balance-Leahy Scale: Poor Standing  balance comment: dependent on RW and therapist for stability                            ADL Overall ADL's : Needs assistance/impaired     Grooming: Wash/dry hands;Wash/dry face;Oral care;Brushing hair;Set up;Sitting Grooming Details (indicate cue type and reason): performed in recliner as Pt is +2 for mobility     Lower Body Bathing: With adaptive equipment;Sit to/from stand;Maximal assistance Lower Body Bathing Details (indicate cue type and reason): Pt educated in long handle sponge Upper Body Dressing : Set up;Sitting   Lower Body Dressing: Maximal assistance;With adaptive equipment;Bed level Lower Body Dressing Details (indicate cue type and reason): Pt practiced using sock donner, grabber/reacher for LB dressing Toilet Transfer: +2 for safety/equipment;Maximal assistance;Stand-pivot;RW Toilet Transfer Details (indicate cue type and reason): simulated from recliner stand pivot to bed with one person max - should be 2 people for future therapy sessions Toileting- Clothing Manipulation and Hygiene: Total assistance       Functional mobility during ADLs: Maximal assistance;+2 for safety/equipment;+2 for physical assistance;Rolling walker General ADL Comments: Pt educated in all AE in "hip kit" (grabber, long handle sponge, long shoe horn, and sock donner) Pt able to perform ADL at bed/sitting level but limited strength and balance prevents standing ADL     Vision     Perception     Praxis      Pertinent Vitals/Pain Pain Assessment: 0-10 Pain Score: 10-Worst pain ever Pain Location: Left knee Pain Descriptors / Indicators: Aching Pain Intervention(s): Limited activity within patient's tolerance;Monitored during session;Repositioned;Ice applied;Patient requesting pain meds-RN notified     Hand Dominance  Right   Extremity/Trunk Assessment Upper Extremity Assessment Upper Extremity Assessment: Generalized weakness   Lower Extremity Assessment Lower Extremity  Assessment: LLE deficits/detail LLE Deficits / Details: pt unable to raise LLE without assistance   Cervical / Trunk Assessment Cervical / Trunk Assessment: Normal   Communication Communication Communication: No difficulties   Cognition Arousal/Alertness: Awake/alert Behavior During Therapy: WFL for tasks assessed/performed Overall Cognitive Status: Within Functional Limits for tasks assessed                     General Comments       Exercises       Shoulder Instructions      Home Living Family/patient expects to be discharged to:: Skilled nursing facility Living Arrangements: Spouse/significant other Available Help at Discharge: Family                         Home Equipment: Gilford Rile - 2 wheels;Walker - 4 wheels;Cane - single point;Bedside commode;Grab bars - toilet;Grab bars - tub/shower          Prior Functioning/Environment Level of Independence: Independent with assistive device(s)        Comments: using SPC for ambulation        OT Problem List: Decreased strength;Decreased range of motion;Decreased activity tolerance;Impaired balance (sitting and/or standing);Decreased safety awareness;Decreased knowledge of use of DME or AE;Pain   OT Treatment/Interventions: Self-care/ADL training;DME and/or AE instruction;Energy conservation;Therapeutic activities;Patient/family education;Balance training    OT Goals(Current goals can be found in the care plan section) Acute Rehab OT Goals Patient Stated Goal: To get better so she can go to the beach OT Goal Formulation: With patient/family Time For Goal Achievement: 11/08/15 Potential to Achieve Goals: Good ADL Goals Pt Will Perform Grooming: with min guard assist;standing Pt Will Perform Lower Body Dressing: with supervision;with caregiver independent in assisting;with adaptive equipment;sit to/from stand Pt Will Transfer to Toilet: with supervision;ambulating;grab bars Pt Will Perform Toileting -  Clothing Manipulation and hygiene: with supervision;sit to/from stand Additional ADL Goal #1: Pt will verbalize 2 ways to conserve energy during ADL without verbal cues from therapist  OT Frequency: Min 2X/week   Barriers to D/C: Decreased caregiver support  Pt's husband is not physically capable of helping her "If she fell, I cannot help her up"       Co-evaluation              End of Session Equipment Utilized During Treatment: Gait belt;Rolling walker;Other (comment) (AE "hip kit") Nurse Communication: Patient requests pain meds  Activity Tolerance: Patient limited by lethargy;Patient limited by pain Patient left: in bed;with call bell/phone within reach;with family/visitor present   Time: 1210-1251 OT Time Calculation (min): 41 min Charges:  OT General Charges $OT Visit: 1 Procedure OT Evaluation $OT Eval Moderate Complexity: 1 Procedure OT Treatments $Self Care/Home Management : 23-37 mins G-Codes:    Merri Ray Diasia Henken November 28, 2015, 3:57 PM  Hulda Humphrey OTR/L [phone]

## 2015-11-02 NOTE — Care Management Note (Signed)
Case Management Note  Patient Details  Name: Alessandra Groutatsy Kehoe MRN: [id number] Date of Birth: 03/31/1940  Subjective/Objective:     Left TKA               Action/Plan: Discharge Planning: Chart reviewed. Referral to CSW for SNF placement.    Expected Discharge Date:                Expected Discharge Plan:  Skilled Nursing Facility  In-House Referral:  Clinical Social Work  Discharge planning Services  CM Consult  Post Acute Care Choice:  NA Choice offered to:  NA  DME Arranged:  N/A DME Agency:  NA  HH Arranged:  NA HH Agency:  NA  Status of Service:  Completed, signed off  If discussed at Long Length of Stay Meetings, dates discussed:    Additional Comments:  Elliot CousinShavis, Orland Visconti Ellen, RN 11/02/2015, 3:58 PM

## 2015-11-03 LAB — CBC
HEMATOCRIT: 33.1 % — AB (ref 36.0–46.0)
HEMOGLOBIN: 11 g/dL — AB (ref 12.0–15.0)
MCH: 30.1 pg (ref 26.0–34.0)
MCHC: 33.2 g/dL (ref 30.0–36.0)
MCV: 90.7 fL (ref 78.0–100.0)
Platelets: 228 10*3/uL (ref 150–400)
RBC: 3.65 MIL/uL — AB (ref 3.87–5.11)
RDW: 13.9 % (ref 11.5–15.5)
WBC: 12.3 10*3/uL — ABNORMAL HIGH (ref 4.0–10.5)

## 2015-11-03 LAB — GLUCOSE, CAPILLARY
GLUCOSE-CAPILLARY: 141 mg/dL — AB (ref 65–99)
GLUCOSE-CAPILLARY: 221 mg/dL — AB (ref 65–99)
Glucose-Capillary: 169 mg/dL — ABNORMAL HIGH (ref 65–99)
Glucose-Capillary: 254 mg/dL — ABNORMAL HIGH (ref 65–99)

## 2015-11-03 NOTE — Progress Notes (Addendum)
Patient ID: Kimberly Miller, female   DOB: 02-Nov-1940, 75 y.o.   MRN: [id number]     Subjective:  Patient reports pain as mild to moderate.  Patient in bed and denies any CP or SOB.  Patient sustained a fall but does not report any issues.  Objective:   VITALS:   Vitals:   11/02/15 0343 11/02/15 1324 11/02/15 2045 11/03/15 0452  BP: (!) 169/72 (!) 152/73 (!) 165/73 (!) 155/68  Pulse: 75 91 92 90  Resp: 18 16 16 17   Temp: 98 F (36.7 C) 99.9 F (37.7 C) 99.7 F (37.6 C) 97.5 F (36.4 C)  TempSrc: Oral   Oral  SpO2: 100% 96% 92% 99%  Weight:      Height:        ABD soft Sensation intact distally Dorsiflexion/Plantar flexion intact Incision: dressing C/D/I and no drainage Dressing changed and wound is good No problems from the fall noted  Lab Results  Component Value Date   WBC 8.6 11/02/2015   HGB 10.9 (L) 11/02/2015   HCT 33.6 (L) 11/02/2015   MCV 92.1 11/02/2015   PLT 227 11/02/2015   BMET    Component Value Date/Time   NA 135 11/02/2015 0757   K 3.7 11/02/2015 0757   CL 101 11/02/2015 0757   CO2 24 11/02/2015 0757   GLUCOSE 162 (H) 11/02/2015 0757   BUN 7 11/02/2015 0757   CREATININE 0.70 11/02/2015 0757   CALCIUM 8.5 (L) 11/02/2015 0757   GFRNONAA >60 11/02/2015 0757   GFRAA >60 11/02/2015 0757     Assessment/Plan: 2 Days Post-Op   Principal Problem:   Primary localized osteoarthritis of left knee Active Problems:   S/P total knee arthroplasty   Advance diet Up with therapy Plan for discharge tomorrow to SNF WBAT Dry dressing PRN   Kimberly Miller, Kimberly Miller 11/03/2015, 7:00 AM  Seen, discussed and agree with above.    Kimberly LucyJoshua Azadeh Hyder, MD Cell [phone]

## 2015-11-03 NOTE — Progress Notes (Signed)
Called patient's husband to notify him of patient's fall.  He instructed her to be careful and call for help next time.  Patient stated "I didn't fall; I slid from the bed to the floor and couldn't get up".    Bed alarm is on, call bell is in reach, patient reminded to call for assistance.

## 2015-11-03 NOTE — Progress Notes (Signed)
Inpatient Diabetes Program Recommendations  AACE/ADA: New Consensus Statement on Inpatient Glycemic Control (2015)  Target Ranges:  Prepandial:   less than 140 mg/dL      Peak postprandial:   less than 180 mg/dL (1-2 hours)      Critically ill patients:  140 - 180 mg/dL   Results for Kimberly Miller, Kimberly Miller (MRN [id number]) as of 11/03/2015 08:29  Ref. Range 11/02/2015 06:36 11/02/2015 11:16 11/02/2015 16:26 11/02/2015 20:46 11/03/2015 06:31  Glucose-Capillary Latest Ref Range: 65 - 99 mg/dL 409159 (H) 811169 (H) 914279 (H)  Novolog 8 units 247 (H)  Lantus 30 units 141 (H)   Results for Kimberly Miller, Kimberly Miller (MRN [id number]) as of 11/03/2015 08:29  Ref. Range 10/25/2015 12:48  Hemoglobin A1C Latest Ref Range: 4.8 - 5.6 % 6.9 (H)   Review of Glycemic Control  Diabetes history: DM2, obesity  Outpatient Diabetes medications: Metformin 500 mg daily, Lantus 30 units QHS  Current orders for Inpatient glycemic control: Carb modified diet, Novolog correction 0-15 units TIDAC, Lantus 30 units QHS  Inpatient Diabetes Program Recommendations:  Dexamethasone given at 0930 on 10/25 one time, which will affect CBG's for several days.  Therefore, we will continue to monitor and not suggest aggressively treating elevated CBG's at this time.  Thank you,  Kristine LineaKaren Aundra Pung, RN, BSN Diabetes Coordinator Inpatient Diabetes Program [phone] (Team Pager)

## 2015-11-03 NOTE — Progress Notes (Signed)
Physical Therapy Treatment Patient Details Name: Kimberly Miller MRN: [id number] DOB: 02/18/1940 Today's Date: 11/03/2015    History of Present Illness Pt is a 75 y.o. female now s/p Lt TKA. PMH: stroke, MI, dieabetes, CAD.     PT Comments    Pt continues to progress slowly with mobility. Pt able to perform stand pivot transfers X2 with +2 assistance but unable to safely attempt ambulation. PT continuing to recommend SNF for further rehabilitation.   Follow Up Recommendations  SNF;Supervision for mobility/OOB     Equipment Recommendations  None recommended by PT    Recommendations for Other Services       Precautions / Restrictions Precautions Precautions: Knee;Fall Restrictions Weight Bearing Restrictions: Yes LLE Weight Bearing: Weight bearing as tolerated    Mobility  Bed Mobility Overal bed mobility: Needs Assistance Bed Mobility: Supine to Sit     Supine to sit: +2 for physical assistance;Mod assist     General bed mobility comments: encouraging pt to assist with use of UEs  Transfers Overall transfer level: Needs assistance Equipment used: Rolling walker (2 wheeled) Transfers: Sit to/from [employer] Sit to Stand: +2 physical assistance;Mod assist Stand pivot transfers: Mod assist;+2 safety/equipment       General transfer comment: Transfers performed from bed to [facility] to chair. Repeated cues needed for hand placement and sequence for transfer. Pt avoiding weightbearing through LLE. While standing, working with pt on weight shifts to LLE  and raising Rt LE.   Ambulation/Gait             General Gait Details: unable to safely attempt   Stairs            Wheelchair Mobility    Modified Rankin (Stroke Patients Only)       Balance Overall balance assessment: Needs assistance Sitting-balance support: Single extremity supported Sitting balance-Leahy Scale: Fair     Standing balance support: Bilateral upper extremity  supported Standing balance-Leahy Scale: Poor Standing balance comment: using rw for support                    Cognition Arousal/Alertness: Awake/alert Behavior During Therapy: WFL for tasks assessed/performed Overall Cognitive Status: Within Functional Limits for tasks assessed                      Exercises Total Joint Exercises Ankle Circles/Pumps: AROM;Both;10 reps Quad Sets: Strengthening;Left;10 reps Heel Slides: AAROM;Left;10 reps Straight Leg Raises: Strengthening;Left;10 reps (max assist) Goniometric ROM: 35 degrees Lt knee flexion    General Comments General comments (skin integrity, edema, etc.): Spouse in room during session. No available chair alarms on unit, nursing notified and spouse agreeing to stay with pt and to notify nursing if he is going to leave.       Pertinent Vitals/Pain Pain Assessment: Faces Faces Pain Scale: Hurts even more Pain Descriptors / Indicators: Aching Pain Intervention(s): Limited activity within patient's tolerance;Monitored during session    Home Living                      Prior Function            PT Goals (current goals can now be found in the care plan section) Acute Rehab PT Goals Patient Stated Goal: Be able to move better.  PT Goal Formulation: With patient Time For Goal Achievement: 11/16/15 Potential to Achieve Goals: Good Progress towards PT goals: Progressing toward goals    Frequency    7X/week  PT Plan Current plan remains appropriate    Co-evaluation             End of Session Equipment Utilized During Treatment: Gait belt;Left knee immobilizer Activity Tolerance: Patient limited by fatigue Patient left: in chair;with call bell/phone within reach;with family/visitor present     Time: 1610-9604 PT Time Calculation (min) (ACUTE ONLY): 46 min  Charges:  $Therapeutic Exercise: 8-22 mins $Therapeutic Activity: 23-37 mins                    G Codes:      Christiane Ha, PT, CSCS Pager [phone] Office [phone]  11/03/2015, 1:11 PM

## 2015-11-03 NOTE — Progress Notes (Signed)
PT progress note  Pt continues to progress slowly with mobility during PT sessions. Pt requiring +2 max assist to stand pivot from [facility] to bed. PT continuing to recommend SNF for further rehabilitation following acute stay. .    11/03/15 1426  PT Visit Information  Last PT Received On 11/03/15  Assistance Needed +2  History of Present Illness Pt is a 75 y.o. female now s/p Lt TKA. PMH: stroke, MI, dieabetes, CAD.   Subjective Data  Subjective Feeling tired, just can't get up.   Patient Stated Goal get back to bed  Precautions  Precautions Knee;Fall  Restrictions  Weight Bearing Restrictions Yes  LLE Weight Bearing WBAT  Pain Assessment  Pain Assessment Faces  Faces Pain Scale 6  Pain Descriptors / Indicators Aching  Pain Intervention(s) Limited activity within patient's tolerance;Monitored during session  Cognition  Arousal/Alertness Awake/alert  Behavior During Therapy WFL for tasks assessed/performed  Overall Cognitive Status Within Functional Limits for tasks assessed  Bed Mobility  Overal bed mobility Needs Assistance;+2 for physical assistance  Bed Mobility Sit to Supine  Sit to supine +2 for physical assistance;Max assist  General bed mobility comments assist provided at trunk and LEs  Transfers  Overall transfer level Needs assistance  Equipment used Rolling walker (2 wheeled)  Transfers Sit to/from Stand  Sit to Stand +2 physical assistance;Max assist  Stand pivot transfers +2 physical assistance;Max assist  General transfer comment Repeated cues needed for pt to stand upright. Pt repeatedly leaning forward and losing stability posteriorly.   Ambulation/Gait  General Gait Details unable to safely attempt  Balance  Overall balance assessment Needs assistance  Sitting-balance support Single extremity supported  Sitting balance-Leahy Scale Fair  Standing balance support Bilateral upper extremity supported  Standing balance-Leahy Scale Poor  Standing balance comment  using rw and physical assist  PT - End of Session  Equipment Utilized During Treatment Gait belt;Left knee immobilizer  Activity Tolerance Patient limited by fatigue  Patient left in bed;with call bell/phone within reach;with family/visitor present;with bed alarm set;with SCD's reapplied (in knee extension)  Nurse Communication Mobility status  PT - Assessment/Plan  PT Plan Current plan remains appropriate  PT Frequency (ACUTE ONLY) 7X/week  Follow Up Recommendations SNF;Supervision for mobility/OOB  PT equipment None recommended by PT  PT Goal Progression  Progress towards PT goals Progressing toward goals  Acute Rehab PT Goals  PT Goal Formulation With patient  Time For Goal Achievement 11/16/15  Potential to Achieve Goals Good  PT Time Calculation  PT Start Time (ACUTE ONLY) 1405  PT Stop Time (ACUTE ONLY) 1421  PT Time Calculation (min) (ACUTE ONLY) 16 min  PT General Charges  $$ ACUTE PT VISIT 1 Procedure  PT Treatments  $Therapeutic Activity 8-22 mins  Christiane HaBenjamin J. Shikha Bibb, PT, CSCS Pager [phone] Office [phone]

## 2015-11-03 NOTE — Progress Notes (Signed)
At 0445 patient was found sitting on the floor near her bed.  She sustained an unwitnessed fall.  She was found by CNA.  She was trying to get out bed to the chair w/o calling for assistance.  Patient has zero noted injuries.  She is alert and oriented x 4.  On-call contacted.  Dressing to her left knee intact.  No new orders at this time.

## 2015-11-04 LAB — GLUCOSE, CAPILLARY
GLUCOSE-CAPILLARY: 189 mg/dL — AB (ref 65–99)
GLUCOSE-CAPILLARY: 125 mg/dL — AB (ref 65–99)

## 2015-11-04 LAB — CBC
HEMATOCRIT: 30.4 % — AB (ref 36.0–46.0)
Hemoglobin: 9.7 g/dL — ABNORMAL LOW (ref 12.0–15.0)
MCH: 29.4 pg (ref 26.0–34.0)
MCHC: 31.9 g/dL (ref 30.0–36.0)
MCV: 92.1 fL (ref 78.0–100.0)
PLATELETS: 244 10*3/uL (ref 150–400)
RBC: 3.3 MIL/uL — ABNORMAL LOW (ref 3.87–5.11)
RDW: 14.4 % (ref 11.5–15.5)
WBC: 10.6 10*3/uL — ABNORMAL HIGH (ref 4.0–10.5)

## 2015-11-04 MED ORDER — VITAMIN B-12 1000 MCG PO TABS
1000.0000 ug | ORAL_TABLET | Freq: Every day | ORAL | Status: DC
  Administered 2015-11-04: 1000 ug via ORAL
  Filled 2015-11-04: qty 1

## 2015-11-04 NOTE — Progress Notes (Signed)
Physical Therapy Treatment Patient Details Name: Kimberly Miller MRN: [id number] DOB: Apr 09, 1940 Today's Date: 11/04/2015    History of Present Illness Pt is a 75 y.o. female now s/p Lt TKA. PMH: stroke, MI, dieabetes, CAD.     PT Comments    Pt mobilizing very slowly during PT sessions. Pt reports having increased pain despite recent meds. Currently the pt is requiring +2 max assist for bed mobility. Unable to achieve full standing despite multiple attempts. PT continuing to recommend SNF for further rehabilitation.   Follow Up Recommendations  SNF;Supervision for mobility/OOB     Equipment Recommendations  None recommended by PT    Recommendations for Other Services       Precautions / Restrictions Precautions Precautions: Knee;Fall Restrictions Weight Bearing Restrictions: Yes LLE Weight Bearing: Weight bearing as tolerated    Mobility  Bed Mobility Overal bed mobility: Needs Assistance;+2 for physical assistance Bed Mobility: Supine to Sit     Supine to sit: +2 for physical assistance;Max assist Sit to supine: +2 for physical assistance;Max assist   General bed mobility comments: encouraging pt to use UEs to assist with rail.   Transfers Overall transfer level: Needs assistance Equipment used: Rolling walker (2 wheeled) Transfers: Sit to/from Stand Sit to Stand: +2 physical assistance;Max assist         General transfer comment: Attempting sit<>stand X3, unable to clear buttock from bed. Heavy encouragement for pt participation.   Ambulation/Gait                 Stairs            Wheelchair Mobility    Modified Rankin (Stroke Patients Only)       Balance Overall balance assessment: Needs assistance Sitting-balance support: No upper extremity supported Sitting balance-Leahy Scale: Fair                              Cognition Arousal/Alertness: Awake/alert Behavior During Therapy: WFL for tasks assessed/performed Overall  Cognitive Status: Within Functional Limits for tasks assessed                      Exercises      General Comments        Pertinent Vitals/Pain Pain Assessment: Faces Faces Pain Scale: Hurts even more Pain Location: Lt knee Pain Descriptors / Indicators: Moaning;Guarding Pain Intervention(s): Limited activity within patient's tolerance;Monitored during session    Home Living                      Prior Function            PT Goals (current goals can now be found in the care plan section) Acute Rehab PT Goals Patient Stated Goal: Do better.  PT Goal Formulation: With patient Time For Goal Achievement: 11/16/15 Potential to Achieve Goals: Good Progress towards PT goals: Progressing toward goals (slow progress)    Frequency    7X/week      PT Plan Current plan remains appropriate    Co-evaluation             End of Session Equipment Utilized During Treatment: Gait belt;Left knee immobilizer Activity Tolerance: Patient limited by fatigue Patient left: in bed;with call bell/phone within reach;with bed alarm set (in knee extension)     Time: 1191-4782 PT Time Calculation (min) (ACUTE ONLY): 18 min  Charges:  $Therapeutic Activity: 8-22 mins  G Codes:      Christiane HaBenjamin J. Myna Freimark, PT, CSCS Pager [phone] Office [phone]  11/04/2015, 12:38 PM

## 2015-11-04 NOTE — Discharge Summary (Signed)
Physician Discharge Summary  Patient ID: Kimberly Miller MRN: [id number] DOB/AGE: [birth year] 75 y.o.  Admit date: 11/01/2015 Discharge date: 11/04/2015  Admission Diagnoses:  Primary localized osteoarthritis of left knee  Discharge Diagnoses:  Principal Problem:   Primary localized osteoarthritis of left knee Active Problems:   S/P total knee arthroplasty   Past Medical History:  Diagnosis Date  . Arthritis   . Coronary artery disease   . Diabetes mellitus without complication (HCC)   . Headache   . Myocardial infarction   . Primary localized osteoarthritis of left knee 11/01/2015  . Stroke [facility])     Surgeries: Procedure(s): TOTAL KNEE ARTHROPLASTY on 11/01/2015   Consultants (if any):   Discharged Condition: Improved  Hospital Course: Kimberly Miller is an 75 y.o. female who was admitted 11/01/2015 with a diagnosis of Primary localized osteoarthritis of left knee and went to the operating room on 11/01/2015 and underwent the above named procedures.    She was given perioperative antibiotics:  Anti-infectives    Start     Dose/Rate Route Frequency Ordered Stop   11/01/15 1800  ceFAZolin (ANCEF) IVPB 2g/100 mL premix     2 g 200 mL/hr over 30 Minutes Intravenous Every 6 hours 11/01/15 1736 11/02/15 0011   11/01/15 0903  ceFAZolin (ANCEF) IVPB 2g/100 mL premix     2 g 200 mL/hr over 30 Minutes Intravenous On call to O.R. 11/01/15 4098 11/01/15 1201    .  She was given sequential compression devices, early ambulation, and xarelto for DVT prophylaxis.  She benefited maximally from the hospital stay and there were no complications.    Recent vital signs:  Vitals:   11/03/15 2013 11/04/15 0500  BP: 139/64 (!) 144/66  Pulse: 91 88  Resp: 16 18  Temp: 98.6 F (37 C) 98.4 F (36.9 C)    Recent laboratory studies:  Lab Results  Component Value Date   HGB 9.7 (L) 11/04/2015   HGB 11.0 (L) 11/03/2015   HGB 10.9 (L) 11/02/2015   Lab Results  Component Value Date    WBC 10.6 (H) 11/04/2015   PLT 244 11/04/2015   No results found for: INR Lab Results  Component Value Date   NA 135 11/02/2015   K 3.7 11/02/2015   CL 101 11/02/2015   CO2 24 11/02/2015   BUN 7 11/02/2015   CREATININE 0.70 11/02/2015   GLUCOSE 162 (H) 11/02/2015    Discharge Medications:     Medication List    STOP taking these medications   acetaminophen 650 MG CR tablet Commonly known as:  TYLENOL   clopidogrel 75 MG tablet Commonly known as:  PLAVIX     TAKE these medications   B-12 5000 MCG Caps Take 1 tablet by mouth daily.   baclofen 10 MG tablet Commonly known as:  LIORESAL Take 1 tablet (10 mg total) by mouth 3 (three) times daily. As needed for muscle spasm   carvedilol 12.5 MG tablet Commonly known as:  COREG Take 12.5 mg by mouth daily.   [employer] D3 5000 units capsule Generic drug:  Cholecalciferol Take 5,000 Units by mouth daily.   gabapentin 300 MG capsule Commonly known as:  NEURONTIN Take 900 mg by mouth at bedtime.   Grape Seed Extract 100 MG Caps Take 100 mg by mouth daily.   insulin glargine 100 UNIT/ML injection Commonly known as:  LANTUS Inject 30 Units into the skin at bedtime.   lisinopril 5 MG tablet Commonly known as:  PRINIVIL,ZESTRIL Take 5 mg  by mouth daily.   metFORMIN 500 MG tablet Commonly known as:  GLUCOPHAGE Take 500 mg by mouth every evening.   ondansetron 4 MG tablet Commonly known as:  ZOFRAN Take 1 tablet (4 mg total) by mouth every 8 (eight) hours as needed for nausea or vomiting.   OVER THE COUNTER MEDICATION Take 1 tablet by mouth daily. B Complex vitamin with Vitamin C and folic acid   oxyCODONE-acetaminophen 5-325 MG tablet Commonly known as:  ROXICET Take 1-2 tablets by mouth every 6 (six) hours as needed for severe pain.   rivaroxaban 10 MG Tabs tablet Commonly known as:  XARELTO Take 1 tablet (10 mg total) by mouth daily.   sennosides-docusate sodium 8.6-50 MG tablet Commonly known as:   SENOKOT-S Take 2 tablets by mouth daily.       Diagnostic Studies: Dg Knee Left Port  Result Date: 11/01/2015 CLINICAL DATA:  Postop day 0 left total knee arthroplasty for osteoarthritis. EXAM: PORTABLE LEFT KNEE - 1-2 VIEW COMPARISON:  None. FINDINGS: Anatomic alignment post left total knee arthroplasty. No acute complicating features. Fluid in the joint as expected postoperatively. IMPRESSION: Anatomic alignment post left total knee arthroplasty without acute complicating features. Electronically Signed   By: Hulan Saashomas  Lawrence M.D.   On: 11/01/2015 16:07    Disposition: SNF    Follow-up Information    Jancarlo Biermann P, MD. Schedule an appointment as soon as possible for a visit in 2 weeks.   Specialty:  Orthopedic Surgery Contact information: [address] [place] [address] [phone]            Signed: Eulas PostLANDAU,Kayde Warehime P 11/04/2015, 9:21 AM

## 2015-11-04 NOTE — Progress Notes (Signed)
Patient ID: Kimberly Miller, female   DOB: 1940-06-20, 75 y.o.   MRN: [id number]     Subjective:  Patient reports pain as mild.  Patient in bed and in no acute distress.  Denies any CP or SOB.  Denies any adverse affects from the fall just still very sore.  Objective:   VITALS:   Vitals:   11/03/15 0452 11/03/15 1342 11/03/15 2013 11/04/15 0500  BP: (!) 155/68 (!) 138/52 139/64 (!) 144/66  Pulse: 90 90 91 88  Resp: 17 16 16 18   Temp: 97.5 F (36.4 C) 99.4 F (37.4 C) 98.6 F (37 C) 98.4 F (36.9 C)  TempSrc: Oral Oral Oral Oral  SpO2: 99% 100% 97% 98%  Weight:      Height:        ABD soft Sensation intact distally Dorsiflexion/Plantar flexion intact Incision: dressing C/D/I and no drainage   Lab Results  Component Value Date   WBC 12.3 (H) 11/03/2015   HGB 11.0 (L) 11/03/2015   HCT 33.1 (L) 11/03/2015   MCV 90.7 11/03/2015   PLT 228 11/03/2015   BMET    Component Value Date/Time   NA 135 11/02/2015 0757   K 3.7 11/02/2015 0757   CL 101 11/02/2015 0757   CO2 24 11/02/2015 0757   GLUCOSE 162 (H) 11/02/2015 0757   BUN 7 11/02/2015 0757   CREATININE 0.70 11/02/2015 0757   CALCIUM 8.5 (L) 11/02/2015 0757   GFRNONAA >60 11/02/2015 0757   GFRAA >60 11/02/2015 0757     Assessment/Plan: 3 Days Post-Op   Principal Problem:   Primary localized osteoarthritis of left knee Active Problems:   S/P total knee arthroplasty   Advance diet Up with therapy Discharge to SNF WBAT Dry dressing PRN   DOUGLAS PARRY, BRANDON 11/04/2015, 8:10 AM  Discussed and agree with above.   Teryl LucyJoshua Lovett Coffin, MD Cell [phone]

## 2015-11-04 NOTE — Care Management Important Message (Signed)
Important Message  Patient Details  Name: Kimberly Miller MRN: [id number] Date of Birth: Jul 24, 1940   Medicare Important Message Given:  Yes    [name] [name] 11/04/2015, 2:59 PM

## 2015-11-04 NOTE — Clinical Social Work Placement (Signed)
   CLINICAL SOCIAL WORK PLACEMENT  NOTE  Date:  11/04/2015  Patient Details  Name: Kimberly Miller MRN: [id number] Date of Birth: 01/06/1941  Clinical Social Work is seeking post-discharge placement for this patient at the Skilled  Nursing Facility level of care (*CSW will initial, date and re-position this form in  chart as items are completed):  Yes   Patient/family provided with [facility] Clinical Social Work Department's list of facilities offering this level of care within the geographic area requested by the patient (or if unable, by the patient's family).  Yes   Patient/family informed of their freedom to choose among providers that offer the needed level of care, that participate in Medicare, Medicaid or managed care program needed by the patient, have an available bed and are willing to accept the patient.  Yes   Patient/family informed of [facility]'s ownership interest in [facility] and [facility], as well as of the fact that they are under no obligation to receive care at these facilities.  PASRR submitted to EDS on       PASRR number received on       Existing PASRR number confirmed on       FL2 transmitted to all facilities in geographic area requested by pt/family on       FL2 transmitted to all facilities within larger geographic area on       Patient informed that his/her managed care company has contracts with or will negotiate with certain facilities, including the following:        Yes   Patient/family informed of bed offers received.  Patient chooses bed at  [facility] was pre-arranged. This is a bundle patinet. )     Physician recommends and patient chooses bed at  [facility])    Patient to be transferred to  [facility] [place] on 11[place]04[place].  Patient to be transferred to facility by  (Husband is agreeable for [facility])     Patient family notified on 11/04/15 of transfer.  Name of family member notified:   (Husband/Taylor)      PHYSICIAN Please sign FL2     Additional Comment:    _______________________________________________ Crista CurbWhitaker, Presley Summerlin R 11/04/2015, 9:02 AM

## 2015-11-04 NOTE — Clinical Social Work Note (Signed)
Clinical Social Work Assessment  Patient Details  Name: Kimberly Miller MRN: [id number] Date of Birth: 1940-01-10  Date of referral:  11/04/15               Reason for consult:  Facility Placement                Permission sought to share information with:   ([facility]) Permission granted to share information::   ([facility])  Name::        Agency::     Relationship::     Contact Information:     Housing/Transportation Living arrangements for the past 2 months:  Single Family Home (Patient lives at home with her husband. ) Source of Information:   (Husband) Patient Interpreter Needed:  None Criminal Activity/Legal Involvement Pertinent to Current Situation/Hospitalization:  No - Comment as needed Significant Relationships:  Spouse, Adult Children Lives with:  Spouse Do you feel safe going back to the place where you live?   (Patienat will be going to [facility] facility in [place], [place]) Need for family participation in patient care:  Yes (Comment)  Care giving concerns:  SW met with patient and husband at bedside. Patient presents to Hospital due to diagnosis of DJD L knee. Patient now needs assistance with ADLs. Patient and husband are agreeable for SNF  In order for patient to get appropriate care.   Social Worker assessment / plan:  SW will refer patient to [facility] in order for her to get SNF placement. Husband states pt has a good support system. SW will refer pt to facility and arrange transportation through [facility] once pt is clear.   Employment status:  Retired Forensic scientist:  Medicare PT Recommendations:  [place] / Referral to community resources:   (SNF)  Patient/Family's Response to care:  Appropriate.   Patient/Family's Understanding of and Emotional Response to Diagnosis, Current Treatment, and Prognosis:  No questions.   Emotional Assessment Appearance:  Appears stated age Attitude/Demeanor/Rapport:   (Appropriate) Affect  (typically observed):  Accepting Orientation:  Oriented to Self, Oriented to Place, Oriented to  Time, Oriented to Situation Alcohol / Substance use:  Not Applicable Psych involvement (Current and /or in the community):  No (Comment)  Discharge Needs  Concerns to be addressed:  No discharge needs identified Readmission within the last 30 days:  No Current discharge risk:  None Barriers to Discharge:      Bernita Buffy 11/04/2015, 8:56 AM

## 2015-11-04 NOTE — [place] FL2 (Signed)
[place] MEDICAID FL2 LEVEL OF CARE SCREENING TOOL     IDENTIFICATION  Patient Name: Kimberly Miller Severe Birthdate: 04/06/1940 Sex: female Admission Date (Current Location): 11/01/2015  [place]:   Octavio Manns([place], [place]   Facility and Address:  [facility], [address], [place], [place] [address]      Provider Number: [id number]  Attending Physician Name and Address:  Teryl LucyJoshua Vitoria Conyer, MD  Relative Name and Phone Number:       Current Level of Care: Hospital Recommended Level of Care: Skilled Nursing Facility Prior Approval Number:    Date Approved/Denied:   PASRR Number:    Discharge Plan: SNF [facility])    Current Diagnoses: Patient Active Problem List   Diagnosis Date Noted  . Primary localized osteoarthritis of left knee 11/01/2015  . S/P total knee arthroplasty 11/01/2015    Orientation RESPIRATION BLADDER Height & Weight     Self, Time, Situation, Place  Normal Continent Weight: 199 lb (90.3 kg) Height:  5\' 10"  (177.8 cm)  BEHAVIORAL SYMPTOMS/MOOD NEUROLOGICAL BOWEL NUTRITION STATUS      Continent  (Nurse Tech states pt is a diabetic)  AMBULATORY STATUS COMMUNICATION OF NEEDS Skin   Total Care Verbally Normal                       Personal Care Assistance Level of Assistance  Bathing, Dressing Bathing Assistance: Limited assistance   Dressing Assistance: Maximum assistance     Functional Limitations Info             SPECIAL CARE FACTORS FREQUENCY                       Contractures      Additional Factors Info                  Current Medications (11/04/2015):  This is the current hospital active medication list Current Facility-Administered Medications  Medication Dose Route Frequency Provider Last Rate Last Dose  . 0.45 % NaCl with KCl 20 mEq / L infusion   Intravenous Continuous Teryl LucyJoshua Alberta Lenhard, MD      . acetaminophen (TYLENOL) tablet 650 mg  650 mg Oral Q6H PRN Teryl LucyJoshua Jenness Stemler, MD   650  mg at 11/03/15 1401   Or  . acetaminophen (TYLENOL) suppository 650 mg  650 mg Rectal Q6H PRN Teryl LucyJoshua Malonie Tatum, MD      . alum & mag hydroxide-simeth (MAALOX/MYLANTA) 200-200-20 MG/5ML suspension 30 mL  30 mL Oral Q4H PRN Teryl LucyJoshua Valetta Mulroy, MD      . bisacodyl (DULCOLAX) suppository 10 mg  10 mg Rectal Daily PRN Teryl LucyJoshua Serapio Edelson, MD      . carvedilol (COREG) tablet 12.5 mg  12.5 mg Oral Daily Teryl LucyJoshua Javarious Elsayed, MD   12.5 mg at 11/04/15 0825  . cholecalciferol (VITAMIN D) tablet 5,000 Units  5,000 Units Oral Daily Teryl LucyJoshua Lilyonna Steidle, MD   5,000 Units at 11/04/15 [phone]  . diphenhydrAMINE (BENADRYL) 12.5 MG/5ML elixir 12.5-25 mg  12.5-25 mg Oral Q4H PRN Teryl LucyJoshua Ishitha Roper, MD      . docusate sodium (COLACE) capsule 100 mg  100 mg Oral BID Teryl LucyJoshua Enolia Koepke, MD   100 mg at 11/04/15 0825  . gabapentin (NEURONTIN) capsule 900 mg  900 mg Oral QHS Teryl LucyJoshua Gwynneth Fabio, MD   900 mg at 11/03/15 2211  . HYDROmorphone (DILAUDID) injection 0.5 mg  0.5 mg Intravenous Q2H PRN Teryl LucyJoshua Ahna Konkle, MD   0.5 mg at 11/01/15 2035  .  insulin aspart (novoLOG) injection 0-15 Units  0-15 Units Subcutaneous TID WC Teryl Lucy, MD   3 Units at 11/04/15 [phone]  . insulin glargine (LANTUS) injection 30 Units  30 Units Subcutaneous QHS Teryl Lucy, MD   30 Units at 11/03/15 2210  . lisinopril (PRINIVIL,ZESTRIL) tablet 5 mg  5 mg Oral Daily Teryl Lucy, MD   5 mg at 11/04/15 0826  . magnesium citrate solution 1 Bottle  1 Bottle Oral Once PRN Teryl Lucy, MD      . menthol-cetylpyridinium (CEPACOL) lozenge 3 mg  1 lozenge Oral PRN Teryl Lucy, MD       Or  . phenol (CHLORASEPTIC) mouth spray 1 spray  1 spray Mouth/Throat PRN Teryl Lucy, MD      . methocarbamol (ROBAXIN) tablet 500 mg  500 mg Oral Q6H PRN Teryl Lucy, MD   500 mg at 11/02/15 1146   Or  . methocarbamol (ROBAXIN) 500 mg in dextrose 5 % 50 mL IVPB  500 mg Intravenous Q6H PRN Teryl Lucy, MD      . metoCLOPramide (REGLAN) tablet 5-10 mg  5-10 mg Oral Q8H PRN Teryl Lucy, MD       Or  .  metoCLOPramide (REGLAN) injection 5-10 mg  5-10 mg Intravenous Q8H PRN Teryl Lucy, MD      . ondansetron [facility]) tablet 4 mg  4 mg Oral Q6H PRN Teryl Lucy, MD       Or  . ondansetron [facility]) injection 4 mg  4 mg Intravenous Q6H PRN Teryl Lucy, MD      . oxyCODONE (Oxy IR/ROXICODONE) immediate release tablet 5-10 mg  5-10 mg Oral Q3H PRN Teryl Lucy, MD   10 mg at 11/04/15 0827  . polyethylene glycol (MIRALAX / GLYCOLAX) packet 17 g  17 g Oral Daily PRN Teryl Lucy, MD      . rivaroxaban Carlena Hurl) tablet 10 mg  10 mg Oral Q breakfast Teryl Lucy, MD   10 mg at 11/04/15 0825  . senna (SENOKOT) tablet 8.6 mg  1 tablet Oral BID Teryl Lucy, MD   8.6 mg at 11/04/15 0826  . vitamin B-12 (CYANOCOBALAMIN) tablet 1,000 mcg  1,000 mcg Oral Daily Teryl Lucy, MD   1,000 mcg at 11/04/15 9604     Discharge Medications: Please see discharge summary for a list of discharge medications.  Relevant Imaging Results:  Relevant Lab Results:   Additional Information  (SS:  [id number] )  Alene Mires

## 2015-11-04 NOTE — Progress Notes (Addendum)
SW reached out to son and made him aware that Sharin MonsTAR has been arranged for transportation to [facility]. SW also made nurse and patient aware.   SW spoke with facility admissions/Christi who states pt is welcomed to come now. She states the report number is [phone] and the patient will be in the N wing of the facility.    Crista CurbBrittney Mersadie Kavanaugh, MSW [phone] 11/04/2015 12:56 PM

## 2015-11-04 NOTE — Progress Notes (Signed)
Patient IV removed. PTAR for transportation to [facility]. Family made aware. Report called to [facility] and given to NIKEJill Darran Gabay A Sheridyn Canino, RN

## 2018-11-10 ENCOUNTER — Inpatient Hospital Stay (HOSPITAL_COMMUNITY)
Admission: AD | Admit: 2018-11-10 | Discharge: 2018-11-18 | DRG: 467 | Disposition: A | Discharge status: Skilled Nursing Facility | Payer: Medicare Other | Source: Other Acute Inpatient Hospital | Attending: Internal Medicine | Admitting: Internal Medicine

## 2018-11-10 DIAGNOSIS — G3184 Mild cognitive impairment, so stated: Secondary | ICD-10-CM | POA: Diagnosis present

## 2018-11-10 DIAGNOSIS — D638 Anemia in other chronic diseases classified elsewhere: Secondary | ICD-10-CM | POA: Diagnosis present

## 2018-11-10 DIAGNOSIS — Z823 Family history of stroke: Secondary | ICD-10-CM | POA: Diagnosis not present

## 2018-11-10 DIAGNOSIS — I252 Old myocardial infarction: Secondary | ICD-10-CM

## 2018-11-10 DIAGNOSIS — Z79899 Other long term (current) drug therapy: Secondary | ICD-10-CM

## 2018-11-10 DIAGNOSIS — K5900 Constipation, unspecified: Secondary | ICD-10-CM | POA: Diagnosis present

## 2018-11-10 DIAGNOSIS — Z8673 Personal history of transient ischemic attack (TIA), and cerebral infarction without residual deficits: Secondary | ICD-10-CM | POA: Diagnosis not present

## 2018-11-10 DIAGNOSIS — Z20828 Contact with and (suspected) exposure to other viral communicable diseases: Secondary | ICD-10-CM | POA: Diagnosis present

## 2018-11-10 DIAGNOSIS — M25562 Pain in left knee: Secondary | ICD-10-CM | POA: Diagnosis not present

## 2018-11-10 DIAGNOSIS — M00062 Staphylococcal arthritis, left knee: Secondary | ICD-10-CM | POA: Diagnosis not present

## 2018-11-10 DIAGNOSIS — Y831 Surgical operation with implant of artificial internal device as the cause of abnormal reaction of the patient, or of later complication, without mention of misadventure at the time of the procedure: Secondary | ICD-10-CM | POA: Diagnosis present

## 2018-11-10 DIAGNOSIS — Z794 Long term (current) use of insulin: Secondary | ICD-10-CM | POA: Diagnosis not present

## 2018-11-10 DIAGNOSIS — M810 Age-related osteoporosis without current pathological fracture: Secondary | ICD-10-CM | POA: Diagnosis present

## 2018-11-10 DIAGNOSIS — D62 Acute posthemorrhagic anemia: Secondary | ICD-10-CM | POA: Diagnosis not present

## 2018-11-10 DIAGNOSIS — Z886 Allergy status to analgesic agent status: Secondary | ICD-10-CM | POA: Diagnosis not present

## 2018-11-10 DIAGNOSIS — I2510 Atherosclerotic heart disease of native coronary artery without angina pectoris: Secondary | ICD-10-CM | POA: Diagnosis present

## 2018-11-10 DIAGNOSIS — R4189 Other symptoms and signs involving cognitive functions and awareness: Secondary | ICD-10-CM | POA: Diagnosis present

## 2018-11-10 DIAGNOSIS — Z888 Allergy status to other drugs, medicaments and biological substances status: Secondary | ICD-10-CM | POA: Diagnosis not present

## 2018-11-10 DIAGNOSIS — Z833 Family history of diabetes mellitus: Secondary | ICD-10-CM | POA: Diagnosis not present

## 2018-11-10 DIAGNOSIS — J449 Chronic obstructive pulmonary disease, unspecified: Secondary | ICD-10-CM | POA: Diagnosis present

## 2018-11-10 DIAGNOSIS — E119 Type 2 diabetes mellitus without complications: Secondary | ICD-10-CM

## 2018-11-10 DIAGNOSIS — I7090 Unspecified atherosclerosis: Secondary | ICD-10-CM | POA: Diagnosis present

## 2018-11-10 DIAGNOSIS — E871 Hypo-osmolality and hyponatremia: Secondary | ICD-10-CM | POA: Diagnosis present

## 2018-11-10 DIAGNOSIS — T8454XA Infection and inflammatory reaction due to internal left knee prosthesis, initial encounter: Secondary | ICD-10-CM | POA: Diagnosis present

## 2018-11-10 DIAGNOSIS — B9561 Methicillin susceptible Staphylococcus aureus infection as the cause of diseases classified elsewhere: Secondary | ICD-10-CM | POA: Diagnosis present

## 2018-11-10 DIAGNOSIS — M25569 Pain in unspecified knee: Secondary | ICD-10-CM

## 2018-11-10 DIAGNOSIS — I10 Essential (primary) hypertension: Secondary | ICD-10-CM | POA: Diagnosis present

## 2018-11-10 DIAGNOSIS — Z89522 Acquired absence of left knee: Secondary | ICD-10-CM | POA: Diagnosis not present

## 2018-11-10 NOTE — Progress Notes (Signed)
Awaiting transfer from [facility] health.  Possible septic prosthetic knee.   Would be best to hold antibiotics until knee fluid can be obtained, not sure if she has already gotten them.  That is assuming that she is not hemodynamically unstable/septic.   Full consult to follow likely in am if patient arrives to [facility].    Johnny Bridge, MD

## 2018-11-10 NOTE — Progress Notes (Signed)
Patient arrived to 5E, pain 10/10. No orders. Awaiting MD notified. Patient unable to walk due to knee. Skin intact burises/ varicose veins both legs. IV in L AC. No other c/o. Patient oriented to room and staff. Will await orders and continue to monitor.

## 2018-11-11 ENCOUNTER — Inpatient Hospital Stay (HOSPITAL_COMMUNITY): Payer: Medicare Other

## 2018-11-11 ENCOUNTER — Other Ambulatory Visit: Payer: Self-pay

## 2018-11-11 ENCOUNTER — Encounter (HOSPITAL_COMMUNITY): Payer: Self-pay

## 2018-11-11 DIAGNOSIS — E119 Type 2 diabetes mellitus without complications: Secondary | ICD-10-CM

## 2018-11-11 DIAGNOSIS — Z794 Long term (current) use of insulin: Secondary | ICD-10-CM

## 2018-11-11 DIAGNOSIS — M25562 Pain in left knee: Secondary | ICD-10-CM

## 2018-11-11 DIAGNOSIS — R4189 Other symptoms and signs involving cognitive functions and awareness: Secondary | ICD-10-CM | POA: Diagnosis present

## 2018-11-11 DIAGNOSIS — I7090 Unspecified atherosclerosis: Secondary | ICD-10-CM

## 2018-11-11 LAB — COMPREHENSIVE METABOLIC PANEL
ALT: 22 U/L (ref 0–44)
AST: 44 U/L — ABNORMAL HIGH (ref 15–41)
Albumin: 2.9 g/dL — ABNORMAL LOW (ref 3.5–5.0)
Alkaline Phosphatase: 44 U/L (ref 38–126)
Anion gap: 10 (ref 5–15)
BUN: 15 mg/dL (ref 8–23)
CO2: 23 mmol/L (ref 22–32)
Calcium: 8.5 mg/dL — ABNORMAL LOW (ref 8.9–10.3)
Chloride: 95 mmol/L — ABNORMAL LOW (ref 98–111)
Creatinine, Ser: 0.75 mg/dL (ref 0.44–1.00)
GFR calc Af Amer: >60 mL/min (ref >60)
GFR calc non Af Amer: >60 mL/min (ref >60)
Glucose, Bld: 277 mg/dL — ABNORMAL HIGH (ref 70–99)
Potassium: 3.7 mmol/L (ref 3.5–5.1)
Sodium: 128 mmol/L — ABNORMAL LOW (ref 135–145)
Total Bilirubin: 1.5 mg/dL — ABNORMAL HIGH (ref 0.3–1.2)
Total Protein: 6.3 g/dL — ABNORMAL LOW (ref 6.5–8.1)

## 2018-11-11 LAB — CBC WITH DIFFERENTIAL/PLATELET
Abs Immature Granulocytes: 0.17 10*3/uL — ABNORMAL HIGH (ref 0.00–0.07)
Basophils Absolute: 0 10*3/uL (ref 0.0–0.1)
Basophils Relative: 0 %
Eosinophils Absolute: 0 10*3/uL (ref 0.0–0.5)
Eosinophils Relative: 0 %
HCT: 40 % (ref 36.0–46.0)
Hemoglobin: 13 g/dL (ref 12.0–15.0)
Immature Granulocytes: 1 %
Lymphocytes Relative: 9 %
Lymphs Abs: 1.9 10*3/uL (ref 0.7–4.0)
MCH: 30.2 pg (ref 26.0–34.0)
MCHC: 32.5 g/dL (ref 30.0–36.0)
MCV: 93 fL (ref 80.0–100.0)
Monocytes Absolute: 1.7 10*3/uL — ABNORMAL HIGH (ref 0.1–1.0)
Monocytes Relative: 8 %
Neutro Abs: 17.3 10*3/uL — ABNORMAL HIGH (ref 1.7–7.7)
Neutrophils Relative %: 82 %
Platelets: 242 10*3/uL (ref 150–400)
RBC: 4.3 MIL/uL (ref 3.87–5.11)
RDW: 14.6 % (ref 11.5–15.5)
WBC: 21.2 10*3/uL — ABNORMAL HIGH (ref 4.0–10.5)
nRBC: 0 % (ref 0.0–0.2)

## 2018-11-11 LAB — C-REACTIVE PROTEIN: CRP: 35.3 mg/dL — ABNORMAL HIGH (ref <1.0)

## 2018-11-11 LAB — GLUCOSE, CAPILLARY
Glucose-Capillary: 284 mg/dL — ABNORMAL HIGH (ref 70–99)
Glucose-Capillary: 175 mg/dL — ABNORMAL HIGH (ref 70–99)
Glucose-Capillary: 261 mg/dL — ABNORMAL HIGH (ref 70–99)
Glucose-Capillary: 150 mg/dL — ABNORMAL HIGH (ref 70–99)
Glucose-Capillary: 165 mg/dL — ABNORMAL HIGH (ref 70–99)

## 2018-11-11 LAB — SARS CORONAVIRUS 2 (TAT 6-24 HRS): SARS Coronavirus 2: NEGATIVE

## 2018-11-11 LAB — LACTIC ACID, PLASMA
Lactic Acid, Venous: 1.2 mmol/L (ref 0.5–1.9)
Lactic Acid, Venous: 1.3 mmol/L (ref 0.5–1.9)

## 2018-11-11 LAB — HEMOGLOBIN A1C
Hgb A1c MFr Bld: 7.6 % — ABNORMAL HIGH (ref 4.8–5.6)
Mean Plasma Glucose: 171.42 mg/dL

## 2018-11-11 LAB — SEDIMENTATION RATE: Sed Rate: 42 mm/hr — ABNORMAL HIGH (ref 0–22)

## 2018-11-11 MED ORDER — VANCOMYCIN HCL 10 G IV SOLR: 1750.0000 mg | INTRAVENOUS | Status: DC

## 2018-11-11 MED ORDER — ENSURE ENLIVE PO LIQD
237.0000 mL | Freq: Two times a day (BID) | ORAL | Status: DC
  Administered 2018-11-11 – 2018-11-17 (×11): 237 mL via ORAL

## 2018-11-11 MED ORDER — ACETAMINOPHEN 325 MG PO TABS
650.0000 mg | ORAL_TABLET | Freq: Four times a day (QID) | ORAL | Status: DC | PRN
  Administered 2018-11-11 – 2018-11-12 (×2): 650 mg via ORAL
  Filled 2018-11-11 (×2): qty 2

## 2018-11-11 MED ORDER — ACETAMINOPHEN 650 MG RE SUPP: 650.0000 mg | Freq: Four times a day (QID) | RECTAL | Status: DC | PRN

## 2018-11-11 MED ORDER — VITAMIN B-12 1000 MCG PO TABS
1000.0000 ug | ORAL_TABLET | Freq: Every day | ORAL | Status: DC
Start: 2018-11-11 — End: 2018-11-18
  Administered 2018-11-11 – 2018-11-18 (×7): 1000 ug via ORAL
  Filled 2018-11-11 (×7): qty 1

## 2018-11-11 MED ORDER — SENNOSIDES-DOCUSATE SODIUM 8.6-50 MG PO TABS
2.0000 | ORAL_TABLET | Freq: Every day | ORAL | Status: DC
  Administered 2018-11-11 – 2018-11-18 (×7): 2 via ORAL
  Filled 2018-11-11 (×7): qty 2

## 2018-11-11 MED ORDER — TRAZODONE HCL 50 MG PO TABS: 50.0000 mg | ORAL_TABLET | Freq: Every evening | ORAL | Status: DC | PRN

## 2018-11-11 MED ORDER — TECHNETIUM TC 99M MEDRONATE IV KIT
20.0000 | PACK | Freq: Once | INTRAVENOUS | Status: AC | PRN
  Administered 2018-11-11: 20 via INTRAVENOUS

## 2018-11-11 MED ORDER — SODIUM CHLORIDE 0.9 % IV SOLN
INTRAVENOUS | Status: DC
  Administered 2018-11-11 – 2018-11-13 (×3): via INTRAVENOUS

## 2018-11-11 MED ORDER — MORPHINE SULFATE (PF) 2 MG/ML IV SOLN
2.0000 mg | Freq: Once | INTRAVENOUS | Status: AC
  Administered 2018-11-11: 2 mg via INTRAVENOUS
  Filled 2018-11-11: qty 1

## 2018-11-11 MED ORDER — HYDROMORPHONE HCL 1 MG/ML IJ SOLN
1.0000 mg | Freq: Four times a day (QID) | INTRAMUSCULAR | Status: AC | PRN
  Administered 2018-11-11 – 2018-11-13 (×4): 1 mg via INTRAVENOUS
  Filled 2018-11-11 (×5): qty 1

## 2018-11-11 MED ORDER — VANCOMYCIN HCL 10 G IV SOLR
1750.0000 mg | Freq: Once | INTRAVENOUS | Status: AC
  Administered 2018-11-11: 1750 mg via INTRAVENOUS
  Filled 2018-11-11: qty 1750

## 2018-11-11 MED ORDER — INSULIN GLARGINE 100 UNIT/ML ~~LOC~~ SOLN
30.0000 [IU] | Freq: Every day | SUBCUTANEOUS | Status: DC
  Administered 2018-11-11 – 2018-11-13 (×4): 30 [IU] via SUBCUTANEOUS
  Filled 2018-11-11 (×5): qty 0.3

## 2018-11-11 MED ORDER — INSULIN ASPART 100 UNIT/ML ~~LOC~~ SOLN
0.0000 [IU] | Freq: Three times a day (TID) | SUBCUTANEOUS | Status: DC
  Administered 2018-11-11: 3 [IU] via SUBCUTANEOUS
  Administered 2018-11-11: 2 [IU] via SUBCUTANEOUS
  Administered 2018-11-12 (×3): 3 [IU] via SUBCUTANEOUS
  Administered 2018-11-13: 08:00:00 5 [IU] via SUBCUTANEOUS
  Administered 2018-11-13 – 2018-11-14 (×2): 3 [IU] via SUBCUTANEOUS
  Administered 2018-11-14 – 2018-11-15 (×4): 5 [IU] via SUBCUTANEOUS
  Administered 2018-11-15 – 2018-11-16 (×2): 3 [IU] via SUBCUTANEOUS

## 2018-11-11 MED ORDER — BACLOFEN 10 MG PO TABS
10.0000 mg | ORAL_TABLET | Freq: Three times a day (TID) | ORAL | Status: DC | PRN
Start: 2018-11-11 — End: 2018-11-18

## 2018-11-11 MED ORDER — SODIUM CHLORIDE 0.9 % IV SOLN: 250.0000 mL | INTRAVENOUS | Status: DC | PRN

## 2018-11-11 MED ORDER — OXYCODONE HCL 5 MG PO TABS
5.0000 mg | ORAL_TABLET | ORAL | Status: DC | PRN
  Administered 2018-11-11 – 2018-11-12 (×5): 5 mg via ORAL
  Filled 2018-11-11 (×5): qty 1

## 2018-11-11 MED ORDER — GABAPENTIN 400 MG PO CAPS
400.0000 mg | ORAL_CAPSULE | Freq: Three times a day (TID) | ORAL | Status: DC
  Administered 2018-11-11 – 2018-11-18 (×21): 400 mg via ORAL
  Filled 2018-11-11 (×21): qty 1

## 2018-11-11 MED ORDER — PIPERACILLIN-TAZOBACTAM 3.375 G IVPB
3.3750 g | Freq: Three times a day (TID) | INTRAVENOUS | Status: DC
  Administered 2018-11-11 – 2018-11-12 (×3): 3.375 g via INTRAVENOUS
  Filled 2018-11-11 (×4): qty 50

## 2018-11-11 MED ORDER — SODIUM CHLORIDE 0.9% FLUSH: 3.0000 mL | INTRAVENOUS | Status: DC | PRN

## 2018-11-11 MED ORDER — LISINOPRIL 5 MG PO TABS
5.0000 mg | ORAL_TABLET | Freq: Every day | ORAL | Status: DC
  Administered 2018-11-11 – 2018-11-18 (×7): 5 mg via ORAL
  Filled 2018-11-11 (×7): qty 1

## 2018-11-11 MED ORDER — SODIUM CHLORIDE 0.9% FLUSH
3.0000 mL | Freq: Two times a day (BID) | INTRAVENOUS | Status: DC
  Administered 2018-11-11 – 2018-11-14 (×4): 3 mL via INTRAVENOUS

## 2018-11-11 MED ORDER — ENOXAPARIN SODIUM 40 MG/0.4ML ~~LOC~~ SOLN
40.0000 mg | SUBCUTANEOUS | Status: DC
  Administered 2018-11-11: 40 mg via SUBCUTANEOUS
  Filled 2018-11-11: qty 0.4

## 2018-11-11 MED ORDER — SODIUM CHLORIDE 0.9 % IV SOLN
2.0000 g | INTRAVENOUS | Status: DC
  Filled 2018-11-11: qty 20

## 2018-11-11 NOTE — Significant Event (Signed)
Rapid Response Event Note  Overview: Time Called: 2831 Arrival Time: 1550 Event Type: MEWS  Initial Focused Assessment:  Patient resting calmly in bed in no distress. Patient asleep but awoke when I approached. Patient oriented X 4, slightly drowsy but nursing team reports having just given her Dilaudid. Call was regarding change in MEWS patient febrile and tachycardic at 103. On arrival patient HR 96, BP stable 126/74. Tylenol ordered but not given. MD was paged prior to arrival. Lactic acid, continuous saline, antibiotics, and telemetry ordered. Plan to administer Tylenol, start fluids and antibiotics, place patient on telemetry, and await lactic acid results. RRT available as needed!    Verdell Kincannon A Koriana Stepien

## 2018-11-11 NOTE — Progress Notes (Signed)
Dilaudid IV 1 mg given for left knee pain. Spouse at bedside. Call bell within reach. Will continue to monitor.

## 2018-11-11 NOTE — Progress Notes (Signed)
Spouse at bedside. Advised spouse of designated Development worker, community. Discussed how son could do a video visit with patient but would not be able to physically visit with patient at this time. Also discussed that once he (spouse) comes into hospital that he must stay within the hospital setting during his visit. Spouse verbalized understanding of these statements.

## 2018-11-11 NOTE — Progress Notes (Signed)
Oxycodone 5 mg PO given to patient for left knee pain. Patient is audibly moaning and stating her knee is painful. Spouse at bedside. Call bell within reach. Will continue to monitor.

## 2018-11-11 NOTE — Progress Notes (Signed)
No charge FU note.  Patient admitted early this AM with suspected L septic knee. - orthopedics is aware and plans to tap knee this AM - plan for abx after tap, discussed with LPN

## 2018-11-11 NOTE — Progress Notes (Addendum)
MEWS Guidelines - (patients age 79 and over)  Red - At High Risk for Deterioration Yellow - At risk for Deterioration  1. Go to room and assess patient 2. Validate data. Is this patient's baseline? If data confirmed: 3. Is this an acute change? 4. Administer prn meds/treatments as ordered. 5. Note Sepsis score 6. Review goals of care 7. Sports coach, RRT nurse and Provider. 8. Ask Provider to come to bedside.  9. Document patient condition/interventions/response. 10. Increase frequency of vital signs and focused assessments to at least q15 minutes x 4, then q30 minutes x2. - If stable, then q1h x3, then q4h x3 and then q8h or dept. routine. - If unstable, contact Provider & RRT nurse. Prepare for possible transfer. 11. Add entry in progress notes using the smart phrase ".MEWS". 1. Go to room and assess patient 2. Validate data. Is this patient's baseline? If data confirmed: 3. Is this an acute change? 4. Administer prn meds/treatments as ordered? 5. Note Sepsis score 6. Review goals of care 7. Sports coach and Provider 8. Call RRT nurse as needed. 9. Document patient condition/interventions/response. 10. Increase frequency of vital signs and focused assessments to at least q2h x2. - If stable, then q4h x2 and then q8h or dept. routine. - If unstable, contact Provider & RRT nurse. Prepare for possible transfer. 11. Add entry in progress notes using the smart phrase ".MEWS".  Green - Likely stable Lavender - Comfort Care Only  1. Continue routine/ordered monitoring.  2. Review goals of care. 1. Continue routine/ordered monitoring. 2. Review goals of care.   1. Go to room and assess patient - patient was assessed.  2. Validate data. Is this patient's baseline? No this is not patient's baseline.  If data confirmed: 3. Is this an acute change? Yes, this is an acute change. 4. Administer prn meds/treatments as ordered. PRN Tylenol administered.  5. Note Sepsis  score  6. Review goals of care 7. Sports coach, RRT nurse and Provider. Charge nurse, RRT, and provider were notified.  8. Ask Provider to come to bedside. Provider was requested to come to bedside.  9. Document patient condition/interventions/response. NS @ 75 mL/hr IV, Vancomycin @ 250 mL/hr IV once, Zosyn 12.5 mL/hr IV every 8 hours, Tylenol 650 mg PO administered 10. Increase frequency of vital signs and focused assessments to at least q15 minutes x 4, then q30 minutes x2. Vital signs obtained per protocol.  - If stable, then q1h x3, then q4h x3 and then q8h or dept. routine. - If unstable, contact Provider & RRT nurse. Prepare for possible transfer. MEWS score changed to Yellow.  11. Add entry in progress notes using the smart phrase ".MEWS".

## 2018-11-11 NOTE — Progress Notes (Signed)
Chaplain responding to consult regarding advance directive.  Noted Dr. Daylene Posey H&P where pt is documented as: "appears cognitively impaired with poor memory."   Consulted with pt's RN, who affirms Dr. Daylene Posey note re: pt's cognitive status.   It appears we may not be able to complete HCPOA with this patient due to inability to establish pt orientation.  RN recommended chaplain visit at 3 pm when pt returns from procedure.  This chaplain will follow with pt and spouse at 3 and assess whether we can support them.

## 2018-11-11 NOTE — H&P (Signed)
History and Physical    Kimberly Miller BPZ:[id number] DOB: 1940-06-12 DOA: 11/10/2018  PCP: Josem Kaufmann, MD (Confirm with patient/family/NH records and if not entered, this has to be entered at [facility] point of entry) Patient coming from: Transfer from [place], was at home prior to ED eval  I have personally briefly reviewed patient's old medical records in [place]  Chief Complaint: left knee pain  HPI: Kimberly Miller is a 78 y.o. female with medical history significant of left TKR by Dr. Mardelle Matte in 2015/05/08, DM, CAD, h/o CVA. Patient reports the onset 3 days PTA of left knee pain and swelling. She denies any trauma or overuse. She denies fevers, rigors. She cannot bear weight due to pain. She sought care at ED in [place]. Xray left knee w/o fx. CT left knee w/o fx, small effusion noted. Lab revealed WBC 21.4. She was transferred 2/2 possible septic left knee. Her pain continues and is severe. Caveat - pt appears cognitively impaired with poor memory.  ED Course: ED course in [place] as per HPI.  Review of Systems: As per HPI otherwise 10 point review of systems negative.    Past Medical History:  Diagnosis Date  . Arthritis   . Coronary artery disease   . Diabetes mellitus without complication ([place])   . Headache   . Myocardial infarction ([place])   . Primary localized osteoarthritis of left knee 11/01/2015  . Stroke [facility])     Past Surgical History:  Procedure Laterality Date  . BACK SURGERY     reports 7 procedures  . CARDIAC CATHETERIZATION  2014-05-08  . COLONOSCOPY    . TOTAL KNEE ARTHROPLASTY Left 11/01/2015   Procedure: TOTAL KNEE ARTHROPLASTY;  Surgeon: Marchia Bond, MD;  Location: [place];  Service: Orthopedics;  Laterality: Left;  . WRIST SURGERY Left    Soc Hx - married 45 years and lives with her husband. She had 1 daughter - deceased in 2000/05/07, 1 son, no grandchildren. 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    reports that she has never smoked. She has never  used smokeless tobacco. She reports that she does not drink alcohol or use drugs.  Allergies  Allergen Reactions  . Daypro [Oxaprozin] Swelling and Rash    Face, mouth, tongue swelling  . Ibuprofen Swelling and Rash    Allergic to ALL anti-inflammatory medications, including excedrine, aleve  . Naproxen Sodium Swelling and Rash    Family History  Problem Relation Age of Onset  . Stroke Mother   . Stroke Father   . Diabetes Brother   . Dementia Sister      Prior to Admission medications   Medication Sig Start Date End Date Taking? Authorizing Provider  baclofen (LIORESAL) 10 MG tablet Take 1 tablet (10 mg total) by mouth 3 (three) times daily. As needed for muscle spasm Patient taking differently: Take 10 mg by mouth 3 (three) times daily as needed for muscle spasms.  11/01/15  Yes Marchia Bond, MD  Cholecalciferol ([employer] D3) 5000 units capsule Take 5,000 Units by mouth daily.   Yes [provider]  Cyanocobalamin (B-12) 5000 MCG CAPS Take 1 tablet by mouth daily.    Yes [provider]  gabapentin (NEURONTIN) 400 MG capsule Take 400 mg by mouth 3 (three) times daily.   Yes [provider]  HYDROcodone-acetaminophen (NORCO/VICODIN) 5-325 MG tablet Take 1 tablet by mouth 2 (two) times daily as needed for moderate pain or severe pain.  10/28/18  Yes  [provider]  insulin glargine (LANTUS) 100 UNIT/ML injection Inject 30 Units into the skin at bedtime.   Yes [provider]  lisinopril (PRINIVIL,ZESTRIL) 5 MG tablet Take 5 mg by mouth daily.   Yes [provider]  OVER THE COUNTER MEDICATION Take 1 tablet by mouth daily. B Complex vitamin with Vitamin C and folic acid   Yes [provider]  traZODone (DESYREL) 50 MG tablet Take 50 mg by mouth at bedtime as needed for sleep.  09/20/18  Yes [provider]  sennosides-docusate sodium (SENOKOT-S) 8.6-50 MG tablet Take 2 tablets by mouth daily. Patient not taking:  Reported on 11/10/2018 11/01/15   Teryl LucyLandau, Joshua, MD    Physical Exam: Vitals:   11/10/18 2229  BP: (!) 159/74  Pulse: 79  Resp: 16  Temp: 99.5 F (37.5 C)  TempSrc: Oral  SpO2: 100%    Constitutional: NAD, calm, comfortable Vitals:   11/10/18 2229  BP: (!) 159/74  Pulse: 79  Resp: 16  Temp: 99.5 F (37.5 C)  TempSrc: Oral  SpO2: 100%   General appearance - loverweight woman who has severe pain with movement of left knee. Eyes: PERRL, lids and conjunctivae normal ENMT: Mucous membranes are moist. Posterior pharynx clear of any exudate or lesions.poor dentition missing several teeth..  Neck: normal, supple, no masses, no thyromegaly Respiratory: clear to auscultation bilaterally, no wheezing, no crackles. Normal respiratory effort. No accessory muscle use.  Cardiovascular: Regular rate and rhythm, no murmurs / rubs / gallops. No extremity edema. 2+ pedal pulses. No carotid bruits.  Abdomen: obese, no tenderness, no masses palpated. No hepatosplenomegaly. Bowel sounds positive.  Musculoskeletal: no clubbing / cyanosis.Left knee with surgical scar, warm to touch, mild swelling, exquistely tender. Skin: no rashes, lesions, ulcers. No induration Neurologic: CN 2-12 grossly intact. Sensation intact,. Strength 5/5 in all 4.  Psychiatric: normal judgment and insight. Alert and oriented x 3 Poor historian - cannot remember salient medical history or answer questions about her medications, family health - suggesting cognitive impairment. Normal mood.     Labs on Admission: I have personally reviewed following labs and imaging studies: outside labs including CBC, Cmet, knee xray, CT knee.  CBC: No results for input(s): WBC, NEUTROABS, HGB, HCT, MCV, PLT in the last 168 hours. Basic Metabolic Panel: No results for input(s): NA, K, CL, CO2, GLUCOSE, BUN, CREATININE, CALCIUM, MG, PHOS in the last 168 hours. GFR: CrCl cannot be calculated (Patient's most recent lab result is older than  the maximum 21 days allowed.). Liver Function Tests: No results for input(s): AST, ALT, ALKPHOS, BILITOT, PROT, ALBUMIN in the last 168 hours. No results for input(s): LIPASE, AMYLASE in the last 168 hours. No results for input(s): AMMONIA in the last 168 hours. Coagulation Profile: No results for input(s): INR, PROTIME in the last 168 hours. Cardiac Enzymes: No results for input(s): CKTOTAL, CKMB, CKMBINDEX, TROPONINI in the last 168 hours. BNP (last 3 results) No results for input(s): PROBNP in the last 8760 hours. HbA1C: No results for input(s): HGBA1C in the last 72 hours. CBG: No results for input(s): GLUCAP in the last 168 hours. Lipid Profile: No results for input(s): CHOL, HDL, LDLCALC, TRIG, CHOLHDL, LDLDIRECT in the last 72 hours. Thyroid Function Tests: No results for input(s): TSH, T4TOTAL, FREET4, T3FREE, THYROIDAB in the last 72 hours. Anemia Panel: No results for input(s): VITAMINB12, FOLATE, FERRITIN, TIBC, IRON, RETICCTPCT in the last 72 hours. Urine analysis: No results found for: COLORURINE, APPEARANCEUR, LABSPEC, PHURINE, GLUCOSEU, HGBUR, BILIRUBINUR,  Lavenia Atlas, PROTEINUR, UROBILINOGEN, NITRITE, LEUKOCYTESUR  Radiological Exams on Admission: No results found.  EKG: Independently reviewed. No EKG  Assessment/Plan Active Problems:   Knee pain, left   Type 2 diabetes mellitus without complication, with long-term current use of insulin (HCC)   Cognitive impairment   ASVD (arteriosclerotic vascular disease)   Acute pain of left knee  (please populate well all problems here in Problem List. (For example, if patient is on BP meds at home and you resume or decide to hold them, it is a problem that needs to be her. Same for CAD, COPD, HLD and so on)   1. Knee pain - acute knee pain, elevated WBC w/o other source of infection suggestive of septic knee. Plan MedSurg admit  Ceftriaxone 2 g q 24  Ortho consult - Dr. Shelba Flake practice for further evaluation  2.  Diabetes - patient per medlist takes basal insulin and metformin. Plan Continue lantus  Glycemic protocol using ss  3. ASVD - old med list included coreg and ACE. Current med reconciliation w/o coreg. Meds to be verifed by her pharmacy later today. Plan Continue current medications.  DVT prophylaxis: lovenox (Lovenox/Heparin/SCD's/anticoagulated/None (if comfort care) Code Status: full code (Full/Partial (specify details) Family Communication: no family to talk to at this hour (Specify name, relationship. Do not write "discussed with patient". Specify tel # if discussed over the phone) Disposition Plan: home when medically stable (specify when and where you expect patient to be discharged) Consults called: Ortho - Dr. Shelba Flake group to be called later today (with names) Admission status: inpatient (inpatient / obs / tele / medical floor / SDU)   Illene Regulus MD [facility] Pager [phone]  If 7PM-7AM, please contact night-coverage [email or web address] Password [email or web address]  11/11/2018, 1:11 AM

## 2018-11-11 NOTE — Progress Notes (Addendum)
Pharmacy Antibiotic Note  Kimberly Miller is a 78 y.o. female admitted on 11/10/2018 with L septic knee.  Pharmacy has been consulted for vancomycin dosing. Severe left knee pain with history of total knee replacement, infection versus gout.  11/3 ortho tapped L Knee: 10 cc turbid fluid, taken to ortho office for Synovasure work-up.  This will include sed rate, CRP, Gram stain, culture and sensitivity, analysis, crystals, and if this appears infected then she will likely need surgery which will be planned for Thursday if necessary.  WBC elevated 21.1, Sed rate 42, CRP 35.3, T m 102  Plan: Zosyn 3.375 mg IV q8hs infuse each dose over 4 hours per MD Vancomycin 1750 mg IV Q 24 hrs. Goal AUC 400-550. Expected AUC: 521.2 SCr used: 0.8 F/u renal function, WBC, temp, culture data Vancomycin levels as needed Daily SCr while on both vanc & zosyn  Height: 5\' 10"  (177.8 cm) Weight: 185 lb (83.9 kg) IBW/kg (Calculated) : 68.5  Temp (24hrs), Avg:100.2 F (37.9 C), Min:99.2 F (37.3 C), Max:102 F (38.9 C)  Recent Labs  Lab 11/11/18 0156  WBC 21.2*  CREATININE 0.75    Estimated Creatinine Clearance: 67.2 mL/min (by C-G formula based on SCr of 0.75 mg/dL).    Allergies  Allergen Reactions  . Daypro [Oxaprozin] Swelling and Rash    Face, mouth, tongue swelling  . Ibuprofen Swelling and Rash    Allergic to ALL anti-inflammatory medications, including excedrine, aleve  . Naproxen Sodium Swelling and Rash    Antimicrobials this admission: 11/3 Vanc>> 11/3 zosyn >> Dose adjustments this admission:  Microbiology results: 11/3 BCx2:  11/3 L knee fluid(cx @ ortho office)>>  Thank you for allowing pharmacy to be a part of this patient's care.  Eudelia Bunch, Pharm.D [phone] 11/11/2018 4:23 PM

## 2018-11-11 NOTE — Progress Notes (Addendum)
Chaplain follow up with pt and spouse re: advance directive.    Pt drowsy. Pt and Spouse unaware of consult for AD.  Notes they did not wish to change HCPOA or Living Will.

## 2018-11-11 NOTE — Progress Notes (Signed)
Will tap her knee this morning.   Johnny Bridge, MD

## 2018-11-11 NOTE — Progress Notes (Signed)
Provided support at bedside with pt and spouse Kimberly Miller) during rapid response event.   Engaged in narrative review, creating space for identifying values in history of relationship that inform care giving.   Offered prayers at bedside.   Kimberly Miller requests prayers for strength and resiliency to care for his spouse, noting "I know this is only beginning and I hope to be able to see it through"   Kimberly Miller speaks of their attendance and leadership in [employer] in [place], [place], where they are [employer].  He notes that he was a [place] and participated with [employer] on mission to several countries.  He and Kimberly Miller value prayer.    FAMILY HISTORY -  Married 57 years.  Two children.  Daughter died 64 years ago in accident.  She was a Education officer, museum and Kimberly Miller shares of her work.   Son formerly worked in Architect business with [employer].  Currently is over-the-road truck driver based out of [place], [place].

## 2018-11-11 NOTE — Progress Notes (Signed)
Patient is flushed in face. Oxygen saturation level is 76%. O2 @ 2L [place] administered. Pulse and temp are elevated. Patient changed to MEWS yellow. Protocol initiated. Contacted Dr. Renaee Munda who gave verbal order for Rocephin 2 mg IV.

## 2018-11-11 NOTE — Progress Notes (Signed)
Patient has returned to floor from bone scan. Patient is verbally moaning and requesting pain medication. Pat, RN, suggested giving patient pain medication prior the bone scan scheduled later today @ 1530.

## 2018-11-11 NOTE — Consult Note (Signed)
ORTHOPAEDIC CONSULTATION  REQUESTING PHYSICIAN: Hollice Gong, Mir Mohammed*  Chief Complaint: Left knee pain  HPI: Kimberly Miller is a 78 y.o. female who complains of left knee pain that has been moderate to severe over the last 3 days.  She had a difficult early postoperative course, back in 2017, her original operation was in October 2017.  After about a year however she improved, and says that she did have episodes of time where she was really having minimal problems with the knee.  I had concerned about infection on the knee because of her persistent pain although she never demonstrated wound healing problems, never had fevers, never had chills, and I actually did a sed rate and CRP back in September 2018 which had a CRP of 31, and a sed rate of approximately 2.3.  Neither of these were particularly impressive, we observed her knee and she got better by report.  Nonetheless for the last 3 days she has had increasing severe pain around the left knee.  Her husband says that she has increasing cognitive dysfunction, she comes and goes with her ability to remember things, seems to be developing some degree of dementia, which is right now her baseline.  She denies current fevers or chills.  She has had multiple back surgeries for what sounds like multiple spine fractures with kyphoplasty's, which were done in the relative recent past.  Past Medical History:  Diagnosis Date  . Arthritis   . Coronary artery disease   . Diabetes mellitus without complication ([place])   . Headache   . Myocardial infarction ([place])   . Primary localized osteoarthritis of left knee 11/01/2015  . Stroke [facility])    Past Surgical History:  Procedure Laterality Date  . BACK SURGERY     reports 7 procedures  . CARDIAC CATHETERIZATION  2016  . COLONOSCOPY    . TOTAL KNEE ARTHROPLASTY Left 11/01/2015   Procedure: TOTAL KNEE ARTHROPLASTY;  Surgeon: Marchia Bond, MD;  Location: [facility];  Service: Orthopedics;  Laterality: Left;   . WRIST SURGERY Left    Social History   Socioeconomic History  . Marital status: Unknown    Spouse name: Not on file  . Number of children: Not on file  . Years of education: Not on file  . Highest education level: Not on file  Occupational History  . Not on file  Social Needs  . Financial resource strain: Not on file  . Food insecurity    Worry: Not on file    Inability: Not on file  . Transportation needs    Medical: Not on file    Non-medical: Not on file  Tobacco Use  . Smoking status: Never Smoker  . Smokeless tobacco: Never Used  Substance and Sexual Activity  . Alcohol use: No  . Drug use: No  . Sexual activity: Not on file  Lifestyle  . Physical activity    Days per week: Not on file    Minutes per session: Not on file  . Stress: Not on file  Relationships  . Social Herbalist on phone: Not on file    Gets together: Not on file    Attends religious service: Not on file    Active member of club or organization: Not on file    Attends meetings of clubs or organizations: Not on file    Relationship status: Not on file  Other Topics Concern  . Not on file  Social History Narrative  . Not on  file   Family History  Problem Relation Age of Onset  . Stroke Mother   . Stroke Father   . Diabetes Brother   . Dementia Sister    Allergies  Allergen Reactions  . Daypro [Oxaprozin] Swelling and Rash    Face, mouth, tongue swelling  . Ibuprofen Swelling and Rash    Allergic to ALL anti-inflammatory medications, including excedrine, aleve  . Naproxen Sodium Swelling and Rash     Positive ROS: All other systems have been reviewed and were otherwise negative with the exception of those mentioned in the HPI and as above.  Physical Exam: BP (!) 139/55 (BP Location: Left Arm)   Pulse 90   Temp 99.3 F (37.4 C)   Resp 16   Ht 5\' 10"  (1.778 m)   Wt 83.9 kg   SpO2 97%   BMI 26.54 kg/m   General: Alert, no acute distress Cardiovascular: No pedal  edema Respiratory: No cyanosis, no use of accessory musculature GI: No organomegaly, abdomen is soft and non-tender Skin: No lesions in the area of chief complaint.  Her surgical wound is healed well Neurologic: Sensation intact distally Psychiatric: Patient is interacts relatively normally with me although she does seem to days off, she does remember my full name, and the fact that I have children, although she is not cognitively totally normal. Lymphatic: No axillary or cervical lymphadenopathy  MUSCULOSKELETAL: Left knee has mild warmth no erythema positive effusion she is exquisitely tenderness and will not allow me to move it.  Assessment: Active Problems:   Knee pain, left   Type 2 diabetes mellitus without complication, with long-term current use of insulin (HCC)   Cognitive impairment   ASVD (arteriosclerotic vascular disease)   Acute pain of left knee  Severe left knee pain with history of total knee replacement, infection versus gout  Plan: This is a concerning presentation, I am going to get a sed rate, CRP, bone scan, and aspirate her knee.  She may need revision surgery, with staged reimplantation if it does appear infected.  May involve Dr. if this does in fact come to be the case.  Preprocedure diagnosis: Left painful total knee replacement Postprocedure diagnosis: Same Procedure: Left knee aspiration Procedure details: After informed consent was obtained the left knee was prepped with Betadine superolaterally and a 18-gauge needle was used to aspirate a total of 10 cc of turbid appearing joint fluid.  This was brought back to my office to be sent to the lab for Synovasure work-up.  This will include sed rate, CRP, Gram stain, culture and sensitivity, analysis, crystals, and if this appears infected then she will likely need surgery which will be planned for Thursday if necessary.  I discussed all of this with the patient as well as her husband in detail.         Sunday, MD Cell [phone]   11/11/2018 10:40 AM

## 2018-11-12 ENCOUNTER — Inpatient Hospital Stay (HOSPITAL_COMMUNITY): Payer: Medicare Other

## 2018-11-12 LAB — GLUCOSE, CAPILLARY
Glucose-Capillary: 192 mg/dL — ABNORMAL HIGH (ref 70–99)
Glucose-Capillary: 190 mg/dL — ABNORMAL HIGH (ref 70–99)
Glucose-Capillary: 199 mg/dL — ABNORMAL HIGH (ref 70–99)
Glucose-Capillary: 206 mg/dL — ABNORMAL HIGH (ref 70–99)

## 2018-11-12 LAB — CREATININE, SERUM
Creatinine, Ser: 0.94 mg/dL (ref 0.44–1.00)
GFR calc Af Amer: >60 mL/min (ref >60)
GFR calc non Af Amer: 58 mL/min — ABNORMAL LOW (ref >60)

## 2018-11-12 MED ORDER — SODIUM CHLORIDE 0.9 % IV SOLN
2.0000 g | INTRAVENOUS | Status: DC
  Administered 2018-11-12 – 2018-11-14 (×2): 2 g via INTRAVENOUS
  Filled 2018-11-12: qty 2
  Filled 2018-11-12 (×2): qty 20
  Filled 2018-11-12: qty 2

## 2018-11-12 MED ORDER — VANCOMYCIN HCL 10 G IV SOLR: 1500.0000 mg | INTRAVENOUS | Status: DC

## 2018-11-12 MED ORDER — ENSURE PRE-SURGERY PO LIQD
296.0000 mL | Freq: Once | ORAL | Status: AC
  Administered 2018-11-12: 296 mL via ORAL
  Filled 2018-11-12: qty 296

## 2018-11-12 MED ORDER — CHLORHEXIDINE GLUCONATE 4 % EX LIQD
60.0000 mL | Freq: Once | CUTANEOUS | Status: AC
  Administered 2018-11-13: 4 via TOPICAL
  Filled 2018-11-12: qty 60

## 2018-11-12 MED ORDER — ENOXAPARIN SODIUM 100 MG/ML ~~LOC~~ SOLN
1.0000 mg/kg | Freq: Two times a day (BID) | SUBCUTANEOUS | Status: DC
  Administered 2018-11-12 – 2018-11-15 (×6): 85 mg via SUBCUTANEOUS
  Filled 2018-11-12 (×5): qty 0.85
  Filled 2018-11-12: qty 1
  Filled 2018-11-12: qty 0.85
  Filled 2018-11-12: qty 1

## 2018-11-12 NOTE — Progress Notes (Signed)
PROGRESS NOTE  Kimberly Miller RJJ:[id number] DOB: 1940-11-17 DOA: 11/10/2018 PCP: Josem Kaufmann, MD  Hospital Course/Subjective: 30 female 78 year old Caucasian female with a past medical history significant for left TKR by Dr. Mardelle Matte in 2017, DM, CAD, history of CVA.  The patient was admitted to the hospital with complaints of 3 days of left knee pain and swelling, x-ray of the knee without fracture but CT with small effusion noted.  Patient was admitted to the hospital, seen in consultation by orthopedic surgery who tapped the knee 11/3.  Per the patient this morning, the orthopedic surgeons are considering removal of possible infected hardware and staged reimplantation.  Yesterday afternoon rapid response was called as the patient's MEWS score was increasing, at that time I obtain peripheral blood cultures, lactate which was 1.9, broaden antibiotics to IV vancomycin and Zosyn.  Since the patient is stable today, will narrow antibiotics back to Rocephin.  Assessment/Plan: Acute left knee pain-with elevated white blood cell count without other source, suggestive of septic knee. -Rocephin 2 g daily -Further plan per orthopedic surgery, anticipate she may need removal of possibly infected hardware  Diabetes-insulin-dependent, will continue home Lantus and add glycemic protocol using sliding scale  DVT Prophylaxis: Lovenox  Code Status: Full code  Family Communication: No family present, discussed at length with patient this morning.  Disposition Plan: Continue inpatient care as the patient will likely need surgery during this hospital stay  Consultants:  Orthopedic surgery  Procedures:  Bedside aspiration of suspected left septic knee 11/3  Antimicrobials:  Rocephin 11/3-   Objective: Vitals:   11/11/18 1925 11/11/18 2011 11/12/18 0014 11/12/18 0606  BP: (!) 158/83 132/75 125/87 (!) 146/78  Pulse: 84 84 81 82  Resp: 14 16 18 18   Temp: 98.4 F (36.9 C) 99 F (37.2 C) 98.5  F (36.9 C) 99.5 F (37.5 C)  TempSrc: Oral Oral Oral Oral  SpO2: 98% 99% 97% 93%  Weight:      Height:        Intake/Output Summary (Last 24 hours) at 11/12/2018 0920 Last data filed at 11/12/2018 0160 Gross per 24 hour  Intake 2098.04 ml  Output 2150 ml  Net -51.96 ml   Filed Weights   11/11/18 0126  Weight: 83.9 kg     Exam: General:  Alert, oriented, calm, in no acute distress Eyes: EOMI, clear sclerea Neck: supple, no masses, trachea mildline  Cardiovascular: RRR, no murmurs or rubs, no peripheral edema  Respiratory: clear to auscultation bilaterally, no wheezes, no crackles  Abdomen: soft, nontender, nondistended, normal bowel tones heard  Skin: dry, no rashes  Musculoskeletal: Left knee with well-healed anterior incision, there is some mild to moderate joint effusion, tender to palpation Psychiatric: appropriate affect, normal speech  Neurologic: extraocular muscles intact, clear speech, moving all extremities with intact sensorium    Data Reviewed: CBC: Recent Labs  Lab 11/11/18 0156  WBC 21.2*  NEUTROABS 17.3*  HGB 13.0  HCT 40.0  MCV 93.0  PLT 109   Basic Metabolic Panel: Recent Labs  Lab 11/11/18 0156 11/12/18 0456  NA 128*  --   K 3.7  --   CL 95*  --   CO2 23  --   GLUCOSE 277*  --   BUN 15  --   CREATININE 0.75 0.94  CALCIUM 8.5*  --    GFR: Estimated Creatinine Clearance: 57.2 mL/min (by C-G formula based on SCr of 0.94 mg/dL). Liver Function Tests: Recent Labs  Lab 11/11/18 0156  AST  44*  ALT 22  ALKPHOS 44  BILITOT 1.5*  PROT 6.3*  ALBUMIN 2.9*   No results for input(s): LIPASE, AMYLASE in the last 168 hours. No results for input(s): AMMONIA in the last 168 hours. Coagulation Profile: No results for input(s): INR, PROTIME in the last 168 hours. Cardiac Enzymes: No results for input(s): CKTOTAL, CKMB, CKMBINDEX, TROPONINI in the last 168 hours. BNP (last 3 results) No results for input(s): PROBNP in the last 8760 hours.  HbA1C: Recent Labs    11/11/18 0156  HGBA1C 7.6*   CBG: Recent Labs  Lab 11/11/18 0752 11/11/18 1127 11/11/18 1529 11/11/18 2059 11/12/18 0738  GLUCAP 175* 261* 150* 165* 192*   Lipid Profile: No results for input(s): CHOL, HDL, LDLCALC, TRIG, CHOLHDL, LDLDIRECT in the last 72 hours. Thyroid Function Tests: No results for input(s): TSH, T4TOTAL, FREET4, T3FREE, THYROIDAB in the last 72 hours. Anemia Panel: No results for input(s): VITAMINB12, FOLATE, FERRITIN, TIBC, IRON, RETICCTPCT in the last 72 hours. Urine analysis: No results found for: COLORURINE, APPEARANCEUR, LABSPEC, PHURINE, GLUCOSEU, HGBUR, BILIRUBINUR, KETONESUR, PROTEINUR, UROBILINOGEN, NITRITE, LEUKOCYTESUR Sepsis Labs: @LABRCNTIP (procalcitonin:4,lacticidven:4)  ) Recent Results (from the past 240 hour(s))  SARS CORONAVIRUS 2 (TAT 6-24 HRS) Nasopharyngeal Nasopharyngeal Swab     Status: None   Collection Time: 11/10/18 10:32 PM   Specimen: Nasopharyngeal Swab  Result Value Ref Range Status   SARS Coronavirus 2 NEGATIVE NEGATIVE Final    Comment: (NOTE) SARS-CoV-2 target nucleic acids are NOT DETECTED. The SARS-CoV-2 RNA is generally detectable in upper and lower respiratory specimens during the acute phase of infection. Negative results do not preclude SARS-CoV-2 infection, do not rule out co-infections with other pathogens, and should not be used as the sole basis for treatment or other patient management decisions. Negative results must be combined with clinical observations, patient history, and epidemiological information. The expected result is Negative. Fact Sheet for Patients: 13/02/20 Fact Sheet for Healthcare Providers: HairSlick.no This test is not yet approved or cleared by the quierodirigir.com FDA and  has been authorized for detection and/or diagnosis of SARS-CoV-2 by FDA under an Emergency Use Authorization (EUA). This EUA will  remain  in effect (meaning this test can be used) for the duration of the COVID-19 declaration under Section 56 4(b)(1) of the Act, 21 U.S.C. section 360bbb-3(b)(1), unless the authorization is terminated or revoked sooner. Performed at [facility], [address], [place], [place]      Studies: Nm Bone Scan 3 Phase Lower Extremity  Result Date: 11/11/2018 CLINICAL DATA:  Severe LEFT knee pain for 3 days, prior LEFT total knee arthroplasty 11/01/2015, question septic LEFT knee EXAM: NUCLEAR MEDICINE 3-PHASE BONE SCAN TECHNIQUE: Radionuclide angiographic images, immediate static blood pool images, and 3-hour delayed static images were obtained of the knees after intravenous injection of radiopharmaceutical. RADIOPHARMACEUTICALS:  21.0 mCi Tc-75m MDP IV COMPARISON:  None Correlation: LEFT knee radiographs 11/01/2015 FINDINGS: Vascular phase: Increased blood flow to the periarticular regions of the LEFT knee. Normal blood flow to RIGHT knee. Blood pool phase: Increased blood pool surrounding the LEFT knee joint. Normal blood pool at RIGHT knee. Delayed phase: Patient was unstable, unable to return to Nuclear Medicine Department for delayed imaging. No delayed images were obtained. IMPRESSION: Increased blood flow and blood pool of tracer surrounding LEFT knee joint consistent with hyperemia, could be due to synovitis though infection is not excluded. The lack of delayed imaging makes this exam nondiagnostic for the presence of aseptic loosening or infection of  the LEFT knee prosthesis. Electronically Signed   By: Ulyses SouthwardMark  Boles M.D.   On: 11/11/2018 16:23    Scheduled Meds: . feeding supplement (ENSURE ENLIVE)  237 mL Oral BID BM  . gabapentin  400 mg Oral TID  . insulin aspart  0-15 Units Subcutaneous TID WC  . insulin glargine  30 Units Subcutaneous QHS  . lisinopril  5 mg Oral Daily  . senna-docusate  2 tablet Oral Daily  . sodium chloride flush  3 mL Intravenous Q12H  .  vitamin B-12  1,000 mcg Oral Daily    Continuous Infusions: . sodium chloride    . sodium chloride 75 mL/hr at 11/12/18 0831  . cefTRIAXone (ROCEPHIN)  IV       LOS: 2 days   Time spent: 21 minutes  Jamieon Lannen Vergie LivingMohammed Raunel Dimartino, MD [facility] Pager [phone]  If 7PM-7AM, please contact night-coverage [email or web address] Password [email or web address] 11/12/2018, 9:20 AM

## 2018-11-12 NOTE — Consult Note (Signed)
Reason for Consult: Infected left total knee replacement Referring Physician: Mardelle Matte, MD  Kimberly Miller is an 78 y.o. female.  HPI: I reviewed the patient's past and current history with Dr. Mardelle Matte as noted below:  Kimberly Miller is a 78 y.o. female who complains of left knee pain that has been moderate to severe over the last 3 days.  She had a difficult early postoperative course, back in 2017, her original operation was in October 2017.  After about a year however she improved, and says that she did have episodes of time where she was really having minimal problems with the knee.  I had concerned about infection on the knee because of her persistent pain although she never demonstrated wound healing problems, never had fevers, never had chills, and I actually did a sed rate and CRP back in September 2018 which had a CRP of 31, and a sed rate of approximately 2.3.  Neither of these were particularly impressive, we observed her knee and she got better by report.  Nonetheless for the last 3 days she has had increasing severe pain around the left knee.  Her husband says that she has increasing cognitive dysfunction, she comes and goes with her ability to remember things, seems to be developing some degree of dementia, which is right now her baseline.  She denies current fevers or chills.  She has had multiple back surgeries for what sounds like multiple spine fractures with kyphoplasty's, which were done in the relative recent past.  Past Medical History:  Diagnosis Date  . Arthritis   . Coronary artery disease   . Diabetes mellitus without complication ([place])   . Headache   . Myocardial infarction ([place])   . Primary localized osteoarthritis of left knee 11/01/2015  . Stroke [facility])     Past Surgical History:  Procedure Laterality Date  . BACK SURGERY     reports 7 procedures  . CARDIAC CATHETERIZATION  2016  . COLONOSCOPY    . TOTAL KNEE ARTHROPLASTY Left 11/01/2015   Procedure: TOTAL KNEE  ARTHROPLASTY;  Surgeon: Marchia Bond, MD;  Location: [facility];  Service: Orthopedics;  Laterality: Left;  . WRIST SURGERY Left     Family History  Problem Relation Age of Onset  . Stroke Mother   . Stroke Father   . Diabetes Brother   . Dementia Sister     Social History:  reports that she has never smoked. She has never used smokeless tobacco. She reports that she does not drink alcohol or use drugs.  Allergies:  Allergies  Allergen Reactions  . Daypro [Oxaprozin] Swelling and Rash    Face, mouth, tongue swelling  . Ibuprofen Swelling and Rash    Allergic to ALL anti-inflammatory medications, including excedrine, aleve  . Naproxen Sodium Swelling and Rash    Medications:  I have reviewed the patient's current medications. Scheduled: . enoxaparin (LOVENOX) injection  1 mg/kg Subcutaneous Q12H  . feeding supplement (ENSURE ENLIVE)  237 mL Oral BID BM  . gabapentin  400 mg Oral TID  . insulin aspart  0-15 Units Subcutaneous TID WC  . insulin glargine  30 Units Subcutaneous QHS  . lisinopril  5 mg Oral Daily  . senna-docusate  2 tablet Oral Daily  . sodium chloride flush  3 mL Intravenous Q12H  . vitamin B-12  1,000 mcg Oral Daily    Results for orders placed or performed during the hospital encounter of 11/10/18 (from the past 24 hour(s))  Glucose, capillary  Status: Abnormal   Collection Time: 11/11/18  8:59 PM  Result Value Ref Range   Glucose-Capillary 165 (H) 70 - 99 mg/dL  Creatinine, serum     Status: Abnormal   Collection Time: 11/12/18  4:56 AM  Result Value Ref Range   Creatinine, Ser 0.94 0.44 - 1.00 mg/dL   GFR calc non Af Amer 58 (L) >60 mL/min   GFR calc Af Amer >60 >60 mL/min  Glucose, capillary     Status: Abnormal   Collection Time: 11/12/18  7:38 AM  Result Value Ref Range   Glucose-Capillary 192 (H) 70 - 99 mg/dL  Glucose, capillary     Status: Abnormal   Collection Time: 11/12/18 11:55 AM  Result Value Ref Range   Glucose-Capillary 190 (H) 70  - 99 mg/dL  Glucose, capillary     Status: Abnormal   Collection Time: 11/12/18  4:28 PM  Result Value Ref Range   Glucose-Capillary 199 (H) 70 - 99 mg/dL   Hgb A1-C - 7.6 CRP - 35.3 ESR - 42  X-ray: CLINICAL DATA:  78 year old female with pain redness and warmth at the left knee. Recent joint aspiration.  EXAM: LEFT KNEE - 1-2 VIEW  COMPARISON:  08/27/2016.  FINDINGS: Chronic left knee arthroplasty. Small to moderate joint effusion on the cross-table lateral view, with trace non dependent gas which is probably related to recent needle aspiration. Hardware appears intact with stable alignment. No acute osseous abnormality identified.  IMPRESSION: 1. Small to moderate joint effusion with trace non dependent gas, likely from recent needle aspiration. 2. Stable total knee hardware. No acute osseous abnormality identified.   Electronically Signed   By: Genevie Ann M.D.  CLINICAL DATA:  Severe LEFT knee pain for 3 days, prior LEFT total knee arthroplasty 11/01/2015, question septic LEFT knee  EXAM: NUCLEAR MEDICINE 3-PHASE BONE SCAN  TECHNIQUE: Radionuclide angiographic images, immediate static blood pool images, and 3-hour delayed static images were obtained of the knees after intravenous injection of radiopharmaceutical.  RADIOPHARMACEUTICALS:  21.0 mCi Tc-79mMDP IV  COMPARISON:  None  Correlation: LEFT knee radiographs 11/01/2015  FINDINGS: Vascular phase: Increased blood flow to the periarticular regions of the LEFT knee. Normal blood flow to RIGHT knee.  Blood pool phase: Increased blood pool surrounding the LEFT knee joint. Normal blood pool at RIGHT knee.  Delayed phase: Patient was unstable, unable to return to Nuclear Medicine Department for delayed imaging. No delayed images were obtained.  IMPRESSION: Increased blood flow and blood pool of tracer surrounding LEFT knee joint consistent with hyperemia, could be due to synovitis  though infection is not excluded.  The lack of delayed imaging makes this exam nondiagnostic for the presence of aseptic loosening or infection of the LEFT knee prosthesis.   Electronically Signed   By: MLavonia DanaM.D.  ROS  AS noted in admitting H&P Some concern for decreased cognition  Blood pressure 126/75, pulse 95, temperature (!) 100.8 F (38.2 C), temperature source Oral, resp. rate 16, height '5\' 10"'$  (1.778 m), weight 83.9 kg, SpO2 96 %.  Physical Exam  Awake and alert Left knee incision healed, no drainage or open wounds No erythema Tender to palpation with minimal effusion (s/p aspiration)   General medical exam reviewed from medical admission for pertinent findings  Assessment/Plan: Infected left total knee replacement  Plan: Needs resection of her index TKR, I&D, placement of antoibitic spacer  Cultures pending from aspiration sent by LMardelle Matte11/3 NPO after MN Consent ordered Plan reviewed with patient  and husband by Mardelle Matte and myself and will be reviewed post-operatively  Improve diabetic management short and long term to reduce risk of recurrent or persistent peri-operative infection   Mauri Pole 11/12/2018, 7:37 PM

## 2018-11-12 NOTE — Progress Notes (Signed)
Pharmacy Antibiotic Note  Kimberly Miller is a 78 y.o. female admitted on 11/10/2018 with L septic knee.  Pharmacy has been consulted for vancomycin dosing. Severe left knee pain with history of total knee replacement, infection versus gout.    11/12/2018 Scr 0.94, CrCl ~ 57.29mls/min   Plan: Change vancomycin dose to 1500mg  IV q24h est AUC 517.8, Scr 0.94 Zosyn 3.375g IV Q8H infused over 4hrs per MD F/u renal function, WBC, temp, culture data Vancomycin levels as needed Daily SCr while on both vanc & zosyn  Height: 5\' 10"  (177.8 cm) Weight: 185 lb (83.9 kg) IBW/kg (Calculated) : 68.5  Temp (24hrs), Avg:99.5 F (37.5 C), Min:97.7 F (36.5 C), Max:102 F (38.9 C)  Recent Labs  Lab 11/11/18 0156 11/11/18 1621 11/11/18 1912 11/12/18 0456  WBC 21.2*  --   --   --   CREATININE 0.75  --   --  0.94  LATICACIDVEN  --  1.2 1.3  --     Estimated Creatinine Clearance: 57.2 mL/min (by C-G formula based on SCr of 0.94 mg/dL).    Allergies  Allergen Reactions  . Daypro [Oxaprozin] Swelling and Rash    Face, mouth, tongue swelling  . Ibuprofen Swelling and Rash    Allergic to ALL anti-inflammatory medications, including excedrine, aleve  . Naproxen Sodium Swelling and Rash    Antimicrobials this admission: 11/3 Vanc>> 11/3 zosyn >> Dose adjustments this admission:  Microbiology results: 11/3 BCx2:  11/3 L knee fluid(cx @ ortho office)>>  Thank you for allowing pharmacy to be a part of this patient's care.  Dolly Rias RPh 11/12/2018, 7:33 AM

## 2018-11-12 NOTE — Progress Notes (Signed)
Nutrition Brief Note  Patient identified on the Malnutrition Screening Tool (MST) Report  No recent weight loss noted in chart. Pt currently eating 100% of meals today.   Wt Readings from Last 15 Encounters:  11/11/18 83.9 kg  11/01/15 90.3 kg    Body mass index is 26.54 kg/m. Patient meets criteria for overweight based on current BMI.   Current diet order is heart healthy/CHO modified, patient is consuming approximately 100% of meals at this time. Labs and medications reviewed.   No nutrition interventions warranted at this time. If nutrition issues arise, please consult RD.   Clayton Bibles, MS, RD, LDN Inpatient Clinical Dietitian Pager: [phone] After Hours Pager: [phone]

## 2018-11-12 NOTE — Progress Notes (Signed)
ANTICOAGULATION CONSULT NOTE - Initial Consult  Pharmacy Consult for enoxaparin Indication: VTE treatment  Allergies  Allergen Reactions  . Daypro [Oxaprozin] Swelling and Rash    Face, mouth, tongue swelling  . Ibuprofen Swelling and Rash    Allergic to ALL anti-inflammatory medications, including excedrine, aleve  . Naproxen Sodium Swelling and Rash    Patient Measurements: Height: 5\' 10"  (177.8 cm) Weight: 185 lb (83.9 kg) IBW/kg (Calculated) : 68.5   Vital Signs: Temp: 99.5 F (37.5 C) (11/04 0606) Temp Source: Oral (11/04 0606) BP: 146/78 (11/04 0606) Pulse Rate: 82 (11/04 0606)  Labs: Recent Labs    11/11/18 0156 11/12/18 0456  HGB 13.0  --   HCT 40.0  --   PLT 242  --   CREATININE 0.75 0.94    Estimated Creatinine Clearance: 57.2 mL/min (by C-G formula based on SCr of 0.94 mg/dL).   Medical History: Past Medical History:  Diagnosis Date  . Arthritis   . Coronary artery disease   . Diabetes mellitus without complication ([place])   . Headache   . Myocardial infarction ([place])   . Primary localized osteoarthritis of left knee 11/01/2015  . Stroke [facility])       Assessment: 78 yo female admitted with L septic knee.  Pharmacy consulted to dose enoxaparin for VTE treatment, no prior AC, last dose of ppx enoxaparin yesterday morning.  11/12/2018  Scr 0.94, CrCl ~ 57.37mls/min H/H and plts WNL  Goal of Therapy:  Anti-Xa level 0.6-1 units/ml 4hrs after LMWH dose given Monitor platelets by anticoagulation protocol   Plan:  Enoxaparin 1mg /kg (85mg ) SQ q12h Monitor CBC q72h Follow for any signs/symptoms of bleed  Dolly Rias RPh 11/12/2018, 9:17 AM

## 2018-11-12 NOTE — Progress Notes (Signed)
     Subjective:  Patient reports pain as slightly improved since she had the aspiration.  Objective:   VITALS:   Vitals:   11/11/18 1925 11/11/18 2011 11/12/18 0014 11/12/18 0606  BP: (!) 158/83 132/75 125/87 (!) 146/78  Pulse: 84 84 81 82  Resp: 14 16 18 18   Temp: 98.4 F (36.9 C) 99 F (37.2 C) 98.5 F (36.9 C) 99.5 F (37.5 C)  TempSrc: Oral Oral Oral Oral  SpO2: 98% 99% 97% 93%  Weight:      Height:        Surgical wounds are well-healed, she still has warmth around the left knee, positive pain with range of motion.  Lab Results  Component Value Date   WBC 21.2 (H) 11/11/2018   HGB 13.0 11/11/2018   HCT 40.0 11/11/2018   MCV 93.0 11/11/2018   PLT 242 11/11/2018   BMET    Component Value Date/Time   NA 128 (L) 11/11/2018 0156   K 3.7 11/11/2018 0156   CL 95 (L) 11/11/2018 0156   CO2 23 11/11/2018 0156   GLUCOSE 277 (H) 11/11/2018 0156   BUN 15 11/11/2018 0156   CREATININE 0.94 11/12/2018 0456   CALCIUM 8.5 (L) 11/11/2018 0156   GFRNONAA 58 (L) 11/12/2018 0456   GFRAA >60 11/12/2018 0456   Sed rate and CRP are both significantly elevated.  Bone scan is concerning for infection.  The clinical appearance of the aspirate looked concerning.  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 Alvan Dame is planned to proceed with explantation and cement interposition of the left knee tomorrow.  She will likely need a PICC line with antibiotics, and subsequent reimplantation if laboratory markers improve.  I discussed all this with the patient as well as with her husband.  I sent the knee aspirate fluid to [facility] through my office, because this is not available through the hospital resources, and will update the  chart once these are available to me.   Johnny Bridge 11/12/2018, 9:27 AM   Marchia Bond, MD Cell [phone]

## 2018-11-13 ENCOUNTER — Inpatient Hospital Stay (HOSPITAL_COMMUNITY): Payer: Medicare Other | Admitting: Certified Registered"

## 2018-11-13 ENCOUNTER — Encounter (HOSPITAL_COMMUNITY)
Admission: AD | Disposition: A | Discharge status: Skilled Nursing Facility | Payer: Self-pay | Source: Other Acute Inpatient Hospital | Attending: Internal Medicine

## 2018-11-13 ENCOUNTER — Inpatient Hospital Stay: Payer: Self-pay

## 2018-11-13 DIAGNOSIS — Z89522 Acquired absence of left knee: Secondary | ICD-10-CM

## 2018-11-13 DIAGNOSIS — T8454XA Infection and inflammatory reaction due to internal left knee prosthesis, initial encounter: Principal | ICD-10-CM

## 2018-11-13 DIAGNOSIS — Z888 Allergy status to other drugs, medicaments and biological substances status: Secondary | ICD-10-CM

## 2018-11-13 DIAGNOSIS — E119 Type 2 diabetes mellitus without complications: Secondary | ICD-10-CM

## 2018-11-13 DIAGNOSIS — G3184 Mild cognitive impairment, so stated: Secondary | ICD-10-CM

## 2018-11-13 DIAGNOSIS — Z886 Allergy status to analgesic agent status: Secondary | ICD-10-CM

## 2018-11-13 HISTORY — PX: EXCISIONAL TOTAL KNEE ARTHROPLASTY WITH ANTIBIOTIC SPACERS: SHX5827

## 2018-11-13 LAB — BASIC METABOLIC PANEL
Anion gap: 9 (ref 5–15)
BUN: 13 mg/dL (ref 8–23)
CO2: 25 mmol/L (ref 22–32)
Calcium: 8.4 mg/dL — ABNORMAL LOW (ref 8.9–10.3)
Chloride: 100 mmol/L (ref 98–111)
Creatinine, Ser: 0.89 mg/dL (ref 0.44–1.00)
GFR calc Af Amer: >60 mL/min (ref >60)
GFR calc non Af Amer: >60 mL/min (ref >60)
Glucose, Bld: 265 mg/dL — ABNORMAL HIGH (ref 70–99)
Potassium: 4.4 mmol/L (ref 3.5–5.1)
Sodium: 134 mmol/L — ABNORMAL LOW (ref 135–145)

## 2018-11-13 LAB — GLUCOSE, CAPILLARY
Glucose-Capillary: 222 mg/dL — ABNORMAL HIGH (ref 70–99)
Glucose-Capillary: 122 mg/dL — ABNORMAL HIGH (ref 70–99)
Glucose-Capillary: 188 mg/dL — ABNORMAL HIGH (ref 70–99)
Glucose-Capillary: 246 mg/dL — ABNORMAL HIGH (ref 70–99)

## 2018-11-13 LAB — CBC
HCT: 35.7 % — ABNORMAL LOW (ref 36.0–46.0)
Hemoglobin: 11.1 g/dL — ABNORMAL LOW (ref 12.0–15.0)
MCH: 30.2 pg (ref 26.0–34.0)
MCHC: 31.1 g/dL (ref 30.0–36.0)
MCV: 97 fL (ref 80.0–100.0)
Platelets: 198 10*3/uL (ref 150–400)
RBC: 3.68 MIL/uL — ABNORMAL LOW (ref 3.87–5.11)
RDW: 14.8 % (ref 11.5–15.5)
WBC: 10 10*3/uL (ref 4.0–10.5)
nRBC: 0 % (ref 0.0–0.2)

## 2018-11-13 LAB — HIV ANTIBODY (ROUTINE TESTING W REFLEX): HIV Screen 4th Generation wRfx: NONREACTIVE

## 2018-11-13 LAB — SURGICAL PCR SCREEN
MRSA, PCR: NEGATIVE
Staphylococcus aureus: POSITIVE — AB

## 2018-11-13 SURGERY — REMOVAL, TOTAL ARTHROPLASTY HARDWARE, KNEE, WITH ANTIBIOTIC SPACER INSERTION
Anesthesia: Spinal | Site: Knee | Laterality: Left

## 2018-11-13 MED ORDER — FENTANYL CITRATE (PF) 100 MCG/2ML IJ SOLN
INTRAMUSCULAR | Status: AC
  Filled 2018-11-13: qty 2

## 2018-11-13 MED ORDER — DIPHENHYDRAMINE HCL 12.5 MG/5ML PO ELIX: 12.5000 mg | ORAL_SOLUTION | ORAL | Status: DC | PRN

## 2018-11-13 MED ORDER — KETOROLAC TROMETHAMINE 30 MG/ML IJ SOLN
INTRAMUSCULAR | Status: DC | PRN
  Administered 2018-11-13: 30 mg via INTRA_ARTICULAR

## 2018-11-13 MED ORDER — DEXAMETHASONE SODIUM PHOSPHATE 10 MG/ML IJ SOLN
10.0000 mg | Freq: Once | INTRAMUSCULAR | Status: AC
  Administered 2018-11-14: 10 mg via INTRAVENOUS
  Filled 2018-11-13: qty 1

## 2018-11-13 MED ORDER — VANCOMYCIN HCL 1000 MG IV SOLR
INTRAVENOUS | Status: DC | PRN
  Administered 2018-11-13: 1000 mg via INTRAVENOUS

## 2018-11-13 MED ORDER — METOCLOPRAMIDE HCL 5 MG/ML IJ SOLN: 5.0000 mg | Freq: Three times a day (TID) | INTRAMUSCULAR | Status: DC | PRN

## 2018-11-13 MED ORDER — ONDANSETRON HCL 4 MG/2ML IJ SOLN: 4.0000 mg | Freq: Four times a day (QID) | INTRAMUSCULAR | Status: DC | PRN

## 2018-11-13 MED ORDER — FENTANYL CITRATE (PF) 100 MCG/2ML IJ SOLN
25.0000 ug | INTRAMUSCULAR | Status: DC | PRN
  Administered 2018-11-13: 13:00:00 25 ug via INTRAVENOUS
  Administered 2018-11-13: 50 ug via INTRAVENOUS

## 2018-11-13 MED ORDER — PROPOFOL 10 MG/ML IV BOLUS: INTRAVENOUS | Status: DC | PRN

## 2018-11-13 MED ORDER — SODIUM CHLORIDE (PF) 0.9 % IJ SOLN
INTRAMUSCULAR | Status: AC
  Filled 2018-11-13: qty 50

## 2018-11-13 MED ORDER — PROPOFOL 10 MG/ML IV BOLUS
INTRAVENOUS | Status: AC
  Filled 2018-11-13: qty 20

## 2018-11-13 MED ORDER — TOBRAMYCIN SULFATE 1.2 G IJ SOLR
INTRAMUSCULAR | Status: DC | PRN
  Administered 2018-11-13: 3.6 g

## 2018-11-13 MED ORDER — MAGNESIUM CITRATE PO SOLN: 1.0000 | Freq: Once | ORAL | Status: DC | PRN

## 2018-11-13 MED ORDER — CEFAZOLIN SODIUM-DEXTROSE 2-3 GM-%(50ML) IV SOLR
INTRAVENOUS | Status: DC | PRN
  Administered 2018-11-13: 2 g via INTRAVENOUS

## 2018-11-13 MED ORDER — TRANEXAMIC ACID-NACL 1000-0.7 MG/100ML-% IV SOLN: 1000.0000 mg | INTRAVENOUS | Status: DC

## 2018-11-13 MED ORDER — FENTANYL CITRATE (PF) 100 MCG/2ML IJ SOLN
INTRAMUSCULAR | Status: AC
  Administered 2018-11-13: 13:00:00 25 ug via INTRAVENOUS
  Filled 2018-11-13: qty 2

## 2018-11-13 MED ORDER — OXYCODONE HCL 5 MG PO TABS
5.0000 mg | ORAL_TABLET | ORAL | Status: DC | PRN
  Administered 2018-11-13 – 2018-11-15 (×3): 10 mg via ORAL
  Administered 2018-11-15: 11:00:00 5 mg via ORAL
  Administered 2018-11-15 – 2018-11-17 (×4): 10 mg via ORAL
  Filled 2018-11-13 (×2): qty 2
  Filled 2018-11-13: qty 1
  Filled 2018-11-13 (×5): qty 2

## 2018-11-13 MED ORDER — SODIUM CHLORIDE 0.9 % IV SOLN
INTRAVENOUS | Status: DC
  Administered 2018-11-13: 15:00:00 via INTRAVENOUS

## 2018-11-13 MED ORDER — ONDANSETRON HCL 4 MG PO TABS: 4.0000 mg | ORAL_TABLET | Freq: Four times a day (QID) | ORAL | Status: DC | PRN

## 2018-11-13 MED ORDER — CEFAZOLIN SODIUM-DEXTROSE 2-4 GM/100ML-% IV SOLN
INTRAVENOUS | Status: AC
  Filled 2018-11-13: qty 100

## 2018-11-13 MED ORDER — PHENYLEPHRINE 40 MCG/ML (10ML) SYRINGE FOR IV PUSH (FOR BLOOD PRESSURE SUPPORT)
PREFILLED_SYRINGE | INTRAVENOUS | Status: AC
  Filled 2018-11-13: qty 10

## 2018-11-13 MED ORDER — VANCOMYCIN HCL IN DEXTROSE 1-5 GM/200ML-% IV SOLN
INTRAVENOUS | Status: AC
  Filled 2018-11-13: qty 200

## 2018-11-13 MED ORDER — VANCOMYCIN HCL 1000 MG IV SOLR
INTRAVENOUS | Status: AC
  Filled 2018-11-13: qty 4000

## 2018-11-13 MED ORDER — LACTATED RINGERS IV SOLN
INTRAVENOUS | Status: DC
  Administered 2018-11-13 (×2): via INTRAVENOUS

## 2018-11-13 MED ORDER — POVIDONE-IODINE 10 % EX SWAB: 2.0000 "application " | Freq: Once | CUTANEOUS | Status: DC

## 2018-11-13 MED ORDER — VANCOMYCIN HCL 1000 MG IV SOLR
INTRAVENOUS | Status: DC | PRN
  Administered 2018-11-13: 1000 mg via TOPICAL
  Administered 2018-11-13: 3000 mg

## 2018-11-13 MED ORDER — ACETAMINOPHEN 10 MG/ML IV SOLN
1000.0000 mg | Freq: Once | INTRAVENOUS | Status: AC
  Administered 2018-11-13: 1000 mg via INTRAVENOUS

## 2018-11-13 MED ORDER — POLYETHYLENE GLYCOL 3350 17 G PO PACK
17.0000 g | PACK | Freq: Two times a day (BID) | ORAL | Status: DC
  Administered 2018-11-13 – 2018-11-17 (×7): 17 g via ORAL
  Filled 2018-11-13 (×9): qty 1

## 2018-11-13 MED ORDER — METOCLOPRAMIDE HCL 5 MG PO TABS: 5.0000 mg | ORAL_TABLET | Freq: Three times a day (TID) | ORAL | Status: DC | PRN

## 2018-11-13 MED ORDER — CHLORHEXIDINE GLUCONATE CLOTH 2 % EX PADS
6.0000 | MEDICATED_PAD | Freq: Every day | CUTANEOUS | Status: AC
  Administered 2018-11-13 – 2018-11-17 (×3): 6 via TOPICAL

## 2018-11-13 MED ORDER — ONDANSETRON HCL 4 MG/2ML IJ SOLN
INTRAMUSCULAR | Status: AC
  Filled 2018-11-13: qty 2

## 2018-11-13 MED ORDER — FERROUS SULFATE 325 (65 FE) MG PO TABS
325.0000 mg | ORAL_TABLET | Freq: Two times a day (BID) | ORAL | Status: DC
  Administered 2018-11-13 – 2018-11-18 (×10): 325 mg via ORAL
  Filled 2018-11-13 (×10): qty 1

## 2018-11-13 MED ORDER — PROPOFOL 500 MG/50ML IV EMUL
INTRAVENOUS | Status: DC | PRN
  Administered 2018-11-13: 75 ug/kg/min via INTRAVENOUS

## 2018-11-13 MED ORDER — HYDROMORPHONE HCL 1 MG/ML IJ SOLN: 0.5000 mg | INTRAMUSCULAR | Status: DC | PRN

## 2018-11-13 MED ORDER — ONDANSETRON HCL 4 MG/2ML IJ SOLN
INTRAMUSCULAR | Status: DC | PRN
  Administered 2018-11-13: 4 mg via INTRAVENOUS

## 2018-11-13 MED ORDER — BUPIVACAINE IN DEXTROSE 0.75-8.25 % IT SOLN
INTRATHECAL | Status: DC | PRN
  Administered 2018-11-13: 1.8 mL via INTRATHECAL

## 2018-11-13 MED ORDER — ACETAMINOPHEN 10 MG/ML IV SOLN
INTRAVENOUS | Status: AC
  Filled 2018-11-13: qty 100

## 2018-11-13 MED ORDER — PROPOFOL 500 MG/50ML IV EMUL
INTRAVENOUS | Status: AC
  Filled 2018-11-13: qty 50

## 2018-11-13 MED ORDER — MUPIROCIN 2 % EX OINT
1.0000 "application " | TOPICAL_OINTMENT | Freq: Two times a day (BID) | CUTANEOUS | Status: AC
  Administered 2018-11-13 – 2018-11-17 (×10): 1 via NASAL
  Filled 2018-11-13 (×4): qty 22

## 2018-11-13 MED ORDER — SODIUM CHLORIDE 0.9 % IR SOLN
Status: DC | PRN
  Administered 2018-11-13 (×2): 3000 mL

## 2018-11-13 MED ORDER — DOCUSATE SODIUM 100 MG PO CAPS
100.0000 mg | ORAL_CAPSULE | Freq: Two times a day (BID) | ORAL | Status: DC
  Administered 2018-11-13 – 2018-11-18 (×10): 100 mg via ORAL
  Filled 2018-11-13 (×10): qty 1

## 2018-11-13 MED ORDER — PROPOFOL 10 MG/ML IV BOLUS
INTRAVENOUS | Status: DC | PRN
  Administered 2018-11-13 (×2): 20 mg via INTRAVENOUS

## 2018-11-13 MED ORDER — BISACODYL 10 MG RE SUPP: 10.0000 mg | Freq: Every day | RECTAL | Status: DC | PRN

## 2018-11-13 MED ORDER — KETOROLAC TROMETHAMINE 30 MG/ML IJ SOLN
INTRAMUSCULAR | Status: AC
  Filled 2018-11-13: qty 1

## 2018-11-13 MED ORDER — MENTHOL 3 MG MT LOZG: 1.0000 | LOZENGE | OROMUCOSAL | Status: DC | PRN

## 2018-11-13 MED ORDER — PHENYLEPHRINE 40 MCG/ML (10ML) SYRINGE FOR IV PUSH (FOR BLOOD PRESSURE SUPPORT)
PREFILLED_SYRINGE | INTRAVENOUS | Status: DC | PRN
  Administered 2018-11-13: 80 ug via INTRAVENOUS
  Administered 2018-11-13: 40 ug via INTRAVENOUS
  Administered 2018-11-13: 100 ug via INTRAVENOUS

## 2018-11-13 MED ORDER — BUPIVACAINE-EPINEPHRINE 0.25% -1:200000 IJ SOLN
INTRAMUSCULAR | Status: AC
  Filled 2018-11-13: qty 1

## 2018-11-13 MED ORDER — PHENOL 1.4 % MT LIQD: 1.0000 | OROMUCOSAL | Status: DC | PRN

## 2018-11-13 MED ORDER — TRAMADOL HCL 50 MG PO TABS
50.0000 mg | ORAL_TABLET | Freq: Four times a day (QID) | ORAL | Status: DC
  Administered 2018-11-13 – 2018-11-18 (×18): 50 mg via ORAL
  Filled 2018-11-13 (×18): qty 1

## 2018-11-13 MED ORDER — VANCOMYCIN HCL 10 G IV SOLR
1500.0000 mg | INTRAVENOUS | Status: DC
  Administered 2018-11-13 – 2018-11-15 (×3): 1500 mg via INTRAVENOUS
  Filled 2018-11-13 (×3): qty 1500

## 2018-11-13 MED ORDER — DEXAMETHASONE SODIUM PHOSPHATE 10 MG/ML IJ SOLN
INTRAMUSCULAR | Status: AC
  Filled 2018-11-13: qty 1

## 2018-11-13 MED ORDER — METOCLOPRAMIDE HCL 5 MG/ML IJ SOLN: 10.0000 mg | Freq: Once | INTRAMUSCULAR | Status: DC | PRN

## 2018-11-13 MED ORDER — SODIUM CHLORIDE 0.9% FLUSH
INTRAVENOUS | Status: DC | PRN
  Administered 2018-11-13: 30 mL

## 2018-11-13 MED ORDER — DEXAMETHASONE SODIUM PHOSPHATE 10 MG/ML IJ SOLN
INTRAMUSCULAR | Status: DC | PRN
  Administered 2018-11-13: 8 mg via INTRAVENOUS

## 2018-11-13 MED ORDER — ALUM & MAG HYDROXIDE-SIMETH 200-200-20 MG/5ML PO SUSP: 15.0000 mL | ORAL | Status: DC | PRN

## 2018-11-13 MED ORDER — TOBRAMYCIN SULFATE 1.2 G IJ SOLR
INTRAMUSCULAR | Status: AC
  Filled 2018-11-13: qty 3.6

## 2018-11-13 MED ORDER — BUPIVACAINE-EPINEPHRINE (PF) 0.25% -1:200000 IJ SOLN
INTRAMUSCULAR | Status: DC | PRN
  Administered 2018-11-13: 30 mL

## 2018-11-13 MED ORDER — MEPERIDINE HCL 50 MG/ML IJ SOLN: 6.2500 mg | INTRAMUSCULAR | Status: DC | PRN

## 2018-11-13 SURGICAL SUPPLY — 49 items
BAG ZIPLOCK 12X15 (MISCELLANEOUS) ×2 IMPLANT
BLADE SAW SGTL 13.0X1.19X90.0M (BLADE) ×2 IMPLANT
BLADE SAW SGTL 81X20 HD (BLADE) ×2 IMPLANT
BNDG ELASTIC 6X10 VLCR STRL LF (GAUZE/BANDAGES/DRESSINGS) ×1 IMPLANT
BNDG ELASTIC 6X5.8 VLCR STR LF (GAUZE/BANDAGES/DRESSINGS) ×2 IMPLANT
BOWL SMART MIX CTS (DISPOSABLE) ×3 IMPLANT
BRUSH FEMORAL CANAL (MISCELLANEOUS) ×2 IMPLANT
CEMENT HV SMART SET (Cement) ×6 IMPLANT
COVER SURGICAL LIGHT HANDLE (MISCELLANEOUS) ×2 IMPLANT
CUFF TOURN SGL QUICK 34 (TOURNIQUET CUFF) ×1
CUFF TRNQT CYL 34X4.125X (TOURNIQUET CUFF) ×1 IMPLANT
DERMABOND ADVANCED (GAUZE/BANDAGES/DRESSINGS) ×1
DERMABOND ADVANCED .7 DNX12 (GAUZE/BANDAGES/DRESSINGS) ×1 IMPLANT
DRAPE U-SHAPE 47X51 STRL (DRAPES) ×2 IMPLANT
DRSG AQUACEL AG ADV 3.5X14 (GAUZE/BANDAGES/DRESSINGS) ×1 IMPLANT
DURAPREP 26ML APPLICATOR (WOUND CARE) ×4 IMPLANT
ELECT REM PT RETURN 15FT ADLT (MISCELLANEOUS) ×2 IMPLANT
FEMUR SIGMA PS SZ 5.0 L (Femur) ×1 IMPLANT
GLOVE BIO SURGEON STRL SZ 6 (GLOVE) ×4 IMPLANT
GLOVE BIOGEL PI IND STRL 6.5 (GLOVE) ×1 IMPLANT
GLOVE BIOGEL PI IND STRL 8.5 (GLOVE) ×1 IMPLANT
GLOVE BIOGEL PI INDICATOR 6.5 (GLOVE) ×1
GLOVE BIOGEL PI INDICATOR 8.5 (GLOVE)
GLOVE ECLIPSE 8.0 STRL XLNG CF (GLOVE) ×2 IMPLANT
GLOVE INDICATOR 7.5 STRL GRN (GLOVE) ×2 IMPLANT
GLOVE ORTHO TXT STRL SZ7.5 (GLOVE) ×5 IMPLANT
GOWN STRL REUS W/TWL LRG LVL3 (GOWN DISPOSABLE) ×4 IMPLANT
GOWN STRL REUS W/TWL XL LVL3 (GOWN DISPOSABLE) ×2 IMPLANT
HANDPIECE INTERPULSE COAX TIP (DISPOSABLE) ×1
JET LAVAGE IRRISEPT WOUND (IRRIGATION / IRRIGATOR) ×2
KIT TURNOVER KIT A (KITS) IMPLANT
LAVAGE JET IRRISEPT WOUND (IRRIGATION / IRRIGATOR) ×1 IMPLANT
MANIFOLD NEPTUNE II (INSTRUMENTS) ×2 IMPLANT
NS IRRIG 1000ML POUR BTL (IV SOLUTION) ×2 IMPLANT
PACK TOTAL KNEE CUSTOM (KITS) ×2 IMPLANT
PLATE ROT INSERT 17.5MM SIZE5 (Plate) ×1 IMPLANT
PROTECTOR NERVE ULNAR (MISCELLANEOUS) ×2 IMPLANT
SET HNDPC FAN SPRY TIP SCT (DISPOSABLE) ×1 IMPLANT
SET PAD KNEE POSITIONER (MISCELLANEOUS) ×2 IMPLANT
STAPLER VISISTAT 35W (STAPLE) IMPLANT
SUT MNCRL AB 3-0 PS2 18 (SUTURE) ×2 IMPLANT
SUT STRATAFIX PDS+ 0 24IN (SUTURE) ×2 IMPLANT
SUT VIC AB 1 CT1 36 (SUTURE) ×4 IMPLANT
SUT VIC AB 2-0 CT1 27 (SUTURE) ×3
SUT VIC AB 2-0 CT1 TAPERPNT 27 (SUTURE) ×3 IMPLANT
TRAY FOLEY MTR SLVR 16FR STAT (SET/KITS/TRAYS/PACK) ×2 IMPLANT
WATER STERILE IRR 1000ML POUR (IV SOLUTION) ×2 IMPLANT
WRAP KNEE MAXI GEL POST OP (GAUZE/BANDAGES/DRESSINGS) ×2 IMPLANT
YANKAUER SUCT BULB TIP 10FT TU (MISCELLANEOUS) ×2 IMPLANT

## 2018-11-13 NOTE — Progress Notes (Signed)
Inpatient Diabetes Program Recommendations  AACE/ADA: New Consensus Statement on Inpatient Glycemic Control (2015)  Target Ranges:  Prepandial:   less than 140 mg/dL      Peak postprandial:   less than 180 mg/dL (1-2 hours)      Critically ill patients:  140 - 180 mg/dL   Lab Results  Component Value Date   GLUCAP 222 (H) 11/13/2018   HGBA1C 7.6 (H) 11/11/2018    Review of Glycemic Control Results for Kimberly Miller, Kimberly Miller (MRN [id number]) as of 11/13/2018 09:54  Ref. Range 11/12/2018 07:38 11/12/2018 11:55 11/12/2018 16:28 11/12/2018 21:02 11/13/2018 06:57  Glucose-Capillary Latest Ref Range: 70 - 99 mg/dL 192 (H) 190 (H) 199 (H) 206 (H) 222 (H)   Diabetes history: DM 2 Outpatient Diabetes medications: Lantus 30 units Current orders for Inpatient glycemic control:  Lantus 30 units qhs Novolog 0-15 units tid  A1c 7.6% on 11/3  Inpatient Diabetes Program Recommendations:    Glucose trends elevated even prior to procedure. Consider increasing Lantus tonight to 35 units.  Thanks,  Tama Headings RN, MSN, BC-ADM Inpatient Diabetes Coordinator Team Pager [phone] (8a-5p)

## 2018-11-13 NOTE — Progress Notes (Signed)
PROGRESS NOTE    Kimberly Miller  ZOX:[id number] DOB: [birth year] DOA: 11/10/2018 PCP: Aniceto BossSettle, Paul C, MD    Brief Narrative:  78 year old lady with prior history of left total knee replacement, diabetes mellitus, CVA, history of coronary artery disease was admitted to the hospital for left knee pain and swelling.  X-rays of the knee does not show any fracture, it was followed with a CT showing effusion.  She was seen by orthopedic surgeon and underwent aspiration of the knee on 11/3.  She was scheduled for removal of possible infected hardware.  Patient underwent removal of the infected hardware in total knee arthroplasty with antibiotic spacers on the left on 11/13/2018 by Dr. Charlann Boxerlin.  Assessment & Plan:   Active Problems:   Knee pain, left   Type 2 diabetes mellitus without complication, with long-term current use of insulin (HCC)   Cognitive impairment   ASVD (arteriosclerotic vascular disease)   Acute pain of left knee   Left knee pain/possible septic arthritis Orthopedics consulted and plan for removal of infected hardware and total knee arthroplasty today by orthopedics. Continue with IV antibiotics and fluids. PT evaluation in the morning. Follow culture report and reports of the tissue   Insulin-dependent diabetes mellitus  CBG (last 3)  Recent Labs    11/12/18 2102 11/13/18 0657 11/13/18 1307  GLUCAP 206* 222* 122*   Resume Lantus and sliding scale insulin at this time.    Mild anemia of chronic disease Hemoglobin stable around 11 today.  Continue to monitor  DVT prophylaxis: Lovenox Code Status: Full code Family Communication: None at bedside  disposition Plan: Pending further evaluation  Consultants:   Orthopedics  Procedures: Left total knee arthroplasty with antibiotic spacer placement Antimicrobials: Vancomycin and Rocephin     Subjective: No new complaints at this time  Objective: Vitals:   11/13/18 1245 11/13/18 1300 11/13/18 1315 11/13/18  1321  BP: 128/64 135/69 (!) 141/61   Pulse: 79 68 70   Resp:  14 18   Temp:    98.8 F (37.1 C)  TempSrc:      SpO2: 100% 100% 99%   Weight:      Height:        Intake/Output Summary (Last 24 hours) at 11/13/2018 1343 Last data filed at 11/13/2018 1307 Gross per 24 hour  Intake 4858.66 ml  Output 1750 ml  Net 3108.66 ml   Filed Weights   11/11/18 0126  Weight: 83.9 kg    Examination:  General exam: Appears calm and comfortable  Respiratory system: Clear to auscultation. Respiratory effort normal. Cardiovascular system: S1 & S2 heard, RRR. No JVD, murmurs, rubs, gallops or clicks. No pedal edema. Gastrointestinal system: Abdomen is nondistended, soft and nontender. No organomegaly or masses felt. Normal bowel sounds heard. Central nervous system: Alert and oriented. No focal neurological deficits. Extremities: Left knee bandaged Skin: No rashes, lesions or ulcers Psychiatry:Mood & affect appropriate.     Data Reviewed: I have personally reviewed following labs and imaging studies  CBC: Recent Labs  Lab 11/11/18 0156 11/13/18 0538  WBC 21.2* 10.0  NEUTROABS 17.3*  --   HGB 13.0 11.1*  HCT 40.0 35.7*  MCV 93.0 97.0  PLT 242 198   Basic Metabolic Panel: Recent Labs  Lab 11/11/18 0156 11/12/18 0456 11/13/18 0538  NA 128*  --  134*  K 3.7  --  4.4  CL 95*  --  100  CO2 23  --  25  GLUCOSE 277*  --  265*  BUN 15  --  13  CREATININE 0.75 0.94 0.89  CALCIUM 8.5*  --  8.4*   GFR: Estimated Creatinine Clearance: 60.4 mL/min (by C-G formula based on SCr of 0.89 mg/dL). Liver Function Tests: Recent Labs  Lab 11/11/18 0156  AST 44*  ALT 22  ALKPHOS 44  BILITOT 1.5*  PROT 6.3*  ALBUMIN 2.9*   No results for input(s): LIPASE, AMYLASE in the last 168 hours. No results for input(s): AMMONIA in the last 168 hours. Coagulation Profile: No results for input(s): INR, PROTIME in the last 168 hours. Cardiac Enzymes: No results for input(s): CKTOTAL, CKMB,  CKMBINDEX, TROPONINI in the last 168 hours. BNP (last 3 results) No results for input(s): PROBNP in the last 8760 hours. HbA1C: Recent Labs    11/11/18 0156  HGBA1C 7.6*   CBG: Recent Labs  Lab 11/12/18 1155 11/12/18 1628 11/12/18 2102 11/13/18 0657 11/13/18 1307  GLUCAP 190* 199* 206* 222* 122*   Lipid Profile: No results for input(s): CHOL, HDL, LDLCALC, TRIG, CHOLHDL, LDLDIRECT in the last 72 hours. Thyroid Function Tests: No results for input(s): TSH, T4TOTAL, FREET4, T3FREE, THYROIDAB in the last 72 hours. Anemia Panel: No results for input(s): VITAMINB12, FOLATE, FERRITIN, TIBC, IRON, RETICCTPCT in the last 72 hours. Sepsis Labs: Recent Labs  Lab 11/11/18 1621 11/11/18 1912  LATICACIDVEN 1.2 1.3    Recent Results (from the past 240 hour(s))  SARS CORONAVIRUS 2 (TAT 6-24 HRS) Nasopharyngeal Nasopharyngeal Swab     Status: None   Collection Time: 11/10/18 10:32 PM   Specimen: Nasopharyngeal Swab  Result Value Ref Range Status   SARS Coronavirus 2 NEGATIVE NEGATIVE Final    Comment: (NOTE) SARS-CoV-2 target nucleic acids are NOT DETECTED. The SARS-CoV-2 RNA is generally detectable in upper and lower respiratory specimens during the acute phase of infection. Negative results do not preclude SARS-CoV-2 infection, do not rule out co-infections with other pathogens, and should not be used as the sole basis for treatment or other patient management decisions. Negative results must be combined with clinical observations, patient history, and epidemiological information. The expected result is Negative. Fact Sheet for Patients: HairSlick.no Fact Sheet for Healthcare Providers: quierodirigir.com This test is not yet approved or cleared by the [place] FDA and  has been authorized for detection and/or diagnosis of SARS-CoV-2 by FDA under an Emergency Use Authorization (EUA). This EUA will remain  in effect  (meaning this test can be used) for the duration of the COVID-19 declaration under Section 56 4(b)(1) of the Act, 21 U.S.C. section 360bbb-3(b)(1), unless the authorization is terminated or revoked sooner. Performed at [facility], [address] [place], [place] [address]   Culture, blood (routine x 2)     Status: None (Preliminary result)   Collection Time: 11/11/18  4:21 PM   Specimen: BLOOD LEFT HAND  Result Value Ref Range Status   Specimen Description   Final    BLOOD LEFT HAND Performed at [facility], [address] [place], [place] [address]    Special Requests   Final    BOTTLES DRAWN AEROBIC AND ANAEROBIC Blood Culture adequate volume Performed at [facility], [address] [place], [place] [address]    Culture   Final    NO GROWTH 2 DAYS Performed at [facility], [address] [place], [place] [address]    Report Status PENDING  Incomplete  Culture, blood (routine x 2)     Status: None (Preliminary result)  Collection Time: 11/11/18  4:25 PM   Specimen: BLOOD RIGHT HAND  Result Value Ref Range Status   Specimen Description   Final    BLOOD RIGHT HAND Performed at [facility], [place] [address], [place], [place] [address]    Special Requests   Final    BOTTLES DRAWN AEROBIC AND ANAEROBIC Blood Culture adequate volume Performed at [facility] [address], [place], [place] [address]    Culture   Final    NO GROWTH 2 DAYS Performed at [facility] [address], [place], [place] [address]    Report Status PENDING  Incomplete  Surgical pcr screen     Status: Abnormal   Collection Time: 11/12/18 11:46 PM   Specimen: Nasal Mucosa; Nasal Swab  Result Value Ref Range Status   MRSA, PCR NEGATIVE NEGATIVE Final   Staphylococcus aureus POSITIVE (A) NEGATIVE Final    Comment: (NOTE) The Xpert SA Assay (FDA approved for NASAL specimens in patients 19 years of  age and older), is one component of a comprehensive surveillance program. It is not intended to diagnose infection nor to guide or monitor treatment. Performed at [facility], [place] [address], [place], [place] [address]          Radiology Studies: Dg Knee 1-2 Views Left  Result Date: 11/12/2018 CLINICAL DATA:  78 year old female with pain redness and warmth at the left knee. Recent joint aspiration. EXAM: LEFT KNEE - 1-2 VIEW COMPARISON:  08/27/2016. FINDINGS: Chronic left knee arthroplasty. Small to moderate joint effusion on the cross-table lateral view, with trace non dependent gas which is probably related to recent needle aspiration. Hardware appears intact with stable alignment. No acute osseous abnormality identified. IMPRESSION: 1. Small to moderate joint effusion with trace non dependent gas, likely from recent needle aspiration. 2. Stable total knee hardware. No acute osseous abnormality identified. Electronically Signed   By: Genevie Ann M.D.   On: 11/12/2018 09:43   Nm Bone Scan 3 Phase Lower Extremity  Result Date: 11/11/2018 CLINICAL DATA:  Severe LEFT knee pain for 3 days, prior LEFT total knee arthroplasty 11/01/2015, question septic LEFT knee EXAM: NUCLEAR MEDICINE 3-PHASE BONE SCAN TECHNIQUE: Radionuclide angiographic images, immediate static blood pool images, and 3-hour delayed static images were obtained of the knees after intravenous injection of radiopharmaceutical. RADIOPHARMACEUTICALS:  21.0 mCi Tc-36m MDP IV COMPARISON:  None Correlation: LEFT knee radiographs 11/01/2015 FINDINGS: Vascular phase: Increased blood flow to the periarticular regions of the LEFT knee. Normal blood flow to RIGHT knee. Blood pool phase: Increased blood pool surrounding the LEFT knee joint. Normal blood pool at RIGHT knee. Delayed phase: Patient was unstable, unable to return to Nuclear Medicine Department for delayed imaging. No delayed images were obtained. IMPRESSION: Increased  blood flow and blood pool of tracer surrounding LEFT knee joint consistent with hyperemia, could be due to synovitis though infection is not excluded. The lack of delayed imaging makes this exam nondiagnostic for the presence of aseptic loosening or infection of the LEFT knee prosthesis. Electronically Signed   By: Lavonia Dana M.D.   On: 11/11/2018 16:23        Scheduled Meds:  [XAJ Hold] Chlorhexidine Gluconate Cloth  6 each Topical Daily   [MAR Hold] enoxaparin (LOVENOX) injection  1 mg/kg Subcutaneous Q12H   [MAR Hold] feeding supplement (ENSURE ENLIVE)  237 mL Oral BID BM   [MAR Hold] gabapentin  400 mg Oral TID   [MAR Hold] insulin aspart  0-15 Units Subcutaneous TID WC   [MAR Hold] insulin glargine  30 Units Subcutaneous QHS   [MAR Hold] lisinopril  5 mg Oral Daily   [MAR Hold] mupirocin ointment  1 application Nasal BID   povidone-iodine  2 application Topical Once   [MAR Hold] senna-docusate  2 tablet Oral Daily   [MAR Hold] sodium chloride flush  3 mL Intravenous Q12H   [MAR Hold] vitamin B-12  1,000 mcg Oral Daily   Continuous Infusions:  [MAR Hold] sodium chloride     sodium chloride 75 mL/hr at 11/13/18 0000   acetaminophen     ceFAZolin     [MAR Hold] cefTRIAXone (ROCEPHIN)  IV Stopped (11/12/18 1404)   lactated ringers 20 mL/hr at 11/13/18 0919   tranexamic acid     vancomycin       LOS: 3 days        Kathlen Mody, MD [facility] Pager [phone]  If 7PM-7AM, please contact night-coverage [email or web address] Password [email or web address] 11/13/2018, 1:43 PM

## 2018-11-13 NOTE — Progress Notes (Signed)
Pt to transfer to 3W after surgery. Belongings given to Information systems manager.

## 2018-11-13 NOTE — Anesthesia Procedure Notes (Deleted)
Performed by: Moselle Rister M, CRNA       

## 2018-11-13 NOTE — Brief Op Note (Signed)
11/10/2018 - 11/13/2018  10:36 AM  PATIENT:  Kimberly Miller  78 y.o. female  PRE-OPERATIVE DIAGNOSIS:  infected left total knee replacement  POST-OPERATIVE DIAGNOSIS:  infected left total knee replacement  PROCEDURE:  Procedure(s): EXCISIONAL TOTAL KNEE ARTHROPLASTY WITH ANTIBIOTIC SPACERS (Left)  SURGEON:  Surgeon(s) and Role:    Paralee Cancel, MD - Primary  PHYSICIAN ASSISTANT: Griffith Citron, PA-C  ANESTHESIA:   spinal  EBL:  <200 cc  BLOOD ADMINISTERED:none  DRAINS: none   LOCAL MEDICATIONS USED:  MARCAINE     SPECIMEN:  Source of Specimen:  left knee synovial fluid, tissue  DISPOSITION OF SPECIMEN:  PATHOLOGY  COUNTS:  YES  TOURNIQUET:  44 min at 216mmHg  DICTATION: .Other Dictation: Dictation Number [id number]  PLAN OF CARE: Admit to inpatient   PATIENT DISPOSITION:  PACU - hemodynamically stable.   Delay start of Pharmacological VTE agent (>24hrs) due to surgical blood loss or risk of bleeding: no

## 2018-11-13 NOTE — Anesthesia Preprocedure Evaluation (Signed)
Anesthesia Evaluation  Patient identified by MRN, date of birth, ID band Patient awake    Reviewed: Allergy & Precautions, NPO status , Patient's Chart, lab work & pertinent test results  Airway Mallampati: II  TM Distance: >3 FB Neck ROM: Full    Dental  (+) Dental Advisory Given   Pulmonary neg pulmonary ROS,    breath sounds clear to auscultation       Cardiovascular hypertension, Pt. on medications and Pt. on home beta blockers + CAD, + Past MI and + Cardiac Stents   Rhythm:Regular Rate:Normal     Neuro/Psych CVA    GI/Hepatic negative GI ROS, Neg liver ROS,   Endo/Other  diabetes, Type 2, Insulin Dependent  Renal/GU negative Renal ROS     Musculoskeletal  (+) Arthritis ,   Abdominal   Peds  Hematology On plavix last dose >7days ago.   Anesthesia Other Findings   Reproductive/Obstetrics                             Lab Results  Component Value Date   WBC 10.0 11/13/2018   HGB 11.1 (L) 11/13/2018   HCT 35.7 (L) 11/13/2018   MCV 97.0 11/13/2018   PLT 198 11/13/2018   Lab Results  Component Value Date   CREATININE 0.89 11/13/2018   BUN 13 11/13/2018   NA 134 (L) 11/13/2018   K 4.4 11/13/2018   CL 100 11/13/2018   CO2 25 11/13/2018   No results found for: INR, PROTIME  Anesthesia Physical  Anesthesia Plan  ASA: III  Anesthesia Plan: Spinal   Post-op Pain Management:    Induction: Intravenous  PONV Risk Score and Plan:   Airway Management Planned: Natural Airway and Simple Face Mask  Additional Equipment:   Intra-op Plan:   Post-operative Plan:   Informed Consent: I have reviewed the patients History and Physical, chart, labs and discussed the procedure including the risks, benefits and alternatives for the proposed anesthesia with the patient or authorized representative who has indicated his/her understanding and acceptance.     Dental advisory  given  Plan Discussed with: CRNA  Anesthesia Plan Comments:         Anesthesia Quick Evaluation

## 2018-11-13 NOTE — Transfer of Care (Signed)
Immediate Anesthesia Transfer of Care Note  Patient: Kimberly Miller  Procedure(s) Performed: Procedure(s): EXCISIONAL TOTAL KNEE ARTHROPLASTY WITH ANTIBIOTIC SPACERS (Left)  Patient Location: PACU  Anesthesia Type:Spinal  Level of Consciousness:  sedated, patient cooperative and responds to stimulation  Airway & Oxygen Therapy:Patient Spontanous Breathing and Patient connected to face mask oxgen  Post-op Assessment:  Report given to PACU RN and Post -op Vital signs reviewed and stable  Post vital signs:  Reviewed and stable  Last Vitals:  Vitals:   11/13/18 0900 11/13/18 1239  BP: (!) 150/66 138/61  Pulse: 71 86  Resp: 16 12  Temp: 37.2 C 37.2 C  SpO2: 17% 356%    Complications: No apparent anesthesia complications

## 2018-11-13 NOTE — H&P (View-Only) (Signed)
Patient ID: Kimberly Miller, female   DOB: 09/07/1940, 78 y.o.   MRN: [id number] Subjective: Day of Surgery Procedure(s) (LRB): EXCISIONAL TOTAL KNEE ARTHROPLASTY WITH ANTIBIOTIC SPACERS (Left)    Patient reports pain as moderate.  No events.  Getting ready for OR this am  Objective:   VITALS:   Vitals:   11/12/18 2230 11/13/18 0432  BP:  (!) 153/85  Pulse:  71  Resp:  16  Temp: 98.5 F (36.9 C) 97.7 F (36.5 C)  SpO2:  100%    Left knee exam unchanged  LABS Recent Labs    11/11/18 0156 11/13/18 0538  HGB 13.0 11.1*  HCT 40.0 35.7*  WBC 21.2* 10.0  PLT 242 198    Recent Labs    11/11/18 0156 11/12/18 0456 11/13/18 0538  NA 128*  --  134*  K 3.7  --  4.4  BUN 15  --  13  CREATININE 0.75 0.94 0.89  GLUCOSE 277*  --  265*    No results for input(s): LABPT, INR in the last 72 hours.   Assessment/Plan: Day of Surgery Procedure(s) (LRB): EXCISIONAL TOTAL KNEE ARTHROPLASTY WITH ANTIBIOTIC SPACERS (Left)  Plan: NPO Consent on chart Getting prepped for OR today for resection of infected left total knee placement of antibiotic spacer PIC line  ID consult for assistance 

## 2018-11-13 NOTE — Consult Note (Signed)
Date of Admission:  11/10/2018          Reason for Consult: Infected prosthetic left knee arthroplasty    Referring Provider: Dr Alvan Dame   Assessment:  1. 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 DM 3. Cognitive impairment  Plan:  1. Start vancomycin and ceftriaxone 2. Follow-up cultures drawn by Dr. Mardelle Matte and from the [facility] 3. Check postop ESR, CRP 4. She will need 6 weeks of IV therapy hopefully with targeted antibiotics though antimicrobials given prior to joint aspiration may compromise results  Active Problems:   Knee pain, left   Type 2 diabetes mellitus without complication, with long-term current use of insulin (HCC)   Cognitive impairment   ASVD (arteriosclerotic vascular disease)   Acute pain of left knee   Scheduled Meds: . Chlorhexidine Gluconate Cloth  6 each Topical Daily  . [START ON 11/14/2018] dexamethasone (DECADRON) injection  10 mg Intravenous Once  . docusate sodium  100 mg Oral BID  . enoxaparin (LOVENOX) injection  1 mg/kg Subcutaneous Q12H  . feeding supplement (ENSURE ENLIVE)  237 mL Oral BID BM  . ferrous sulfate  325 mg Oral BID WC  . gabapentin  400 mg Oral TID  . insulin aspart  0-15 Units Subcutaneous TID WC  . insulin glargine  30 Units Subcutaneous QHS  . lisinopril  5 mg Oral Daily  . mupirocin ointment  1 application Nasal BID  . polyethylene glycol  17 g Oral BID  . senna-docusate  2 tablet Oral Daily  . sodium chloride flush  3 mL Intravenous Q12H  . traMADol  50 mg Oral Q6H  . vitamin B-12  1,000 mcg Oral Daily   Continuous Infusions: . sodium chloride    . sodium chloride 75 mL/hr at 11/13/18 0000  . sodium chloride 75 mL/hr at 11/13/18 1515  . acetaminophen    . ceFAZolin    . cefTRIAXone (ROCEPHIN)  IV Stopped (11/12/18 1404)  . vancomycin    . vancomycin     PRN Meds:.sodium chloride, acetaminophen **OR** acetaminophen, alum & mag hydroxide-simeth, baclofen, bisacodyl,  diphenhydrAMINE, HYDROmorphone (DILAUDID) injection, magnesium citrate, menthol-cetylpyridinium **OR** phenol, metoCLOPramide **OR** metoCLOPramide (REGLAN) injection, ondansetron **OR** ondansetron (ZOFRAN) IV, oxyCODONE, sodium chloride flush, traZODone  HPI: Kimberly Miller is a 78 y.o. female who underwent total knee arthroplasty in 2017 with Dr. Thea Alken.  They gave a story of not having any postoperative complications but the consult note from Dr. Mardelle Matte indicates that she had a difficult postoperative course.  She had had episodes where she had some problems with the knee and there was concern for infection due to persistent pain.  Inflammatory markers were checked with a sed rate that was normal CRP though there was 31.  She continue to be observed.  3 days prior to admission to the hospital on November 3 she however began to have increasing pain on the left knee.  Initially her husband wondered if it was related to her back pain where she has significant pathology as well but she was adamant that her knee was hurting.  She initially went to a hospital in [place] where she was evaluated the ER.  Apparently a scan CT was done which showed a small effusion in the left knee and a white cell 0.1 thousand.  The husband believes she was not given any antibiotics there.  She ultimately was transferred from [place] [facility].  She was admitted to  the hospitalist service where blood cultures were drawn.  She had some cognitive dysfunction but she was with otherwise stable vital signs and was in fact hypertensive.  Unfortunately she was given ceftriaxone and then later Zosyn.Marland Kitchen  He knee was aspirated the following morning by Dr. Mardelle Matte. I do not have ability to see the cell count I can see then aerobic anaerobic culture was done and a gram-positive cocci were seen on Gram stain.  She was taken to the operating room today where Dr. Alvan Dame performed EXCISIONAL [place] (  Hopefully will grow a specific organism from culture so I can give her target antimicrobial therapy.  I would plan on getting her 6 weeks of therapy and then observing how she does off antibiotics with checking inflammatory markers symptoms and considering aspirating the knee off antibiotics to see if there are any white cells prior to consideration of reimplantation.     Review of Systems: Review of Systems  Unable to perform ROS: Mental acuity    Past Medical History:  Diagnosis Date  . Arthritis   . Coronary artery disease   . Diabetes mellitus without complication ([place])   . Headache   . Myocardial infarction ([place])   . Primary localized osteoarthritis of left knee 11/01/2015  . Stroke [facility])     Social History   Tobacco Use  . Smoking status: Never Smoker  . Smokeless tobacco: Never Used  Substance Use Topics  . Alcohol use: No  . Drug use: No    Family History  Problem Relation Age of Onset  . Stroke Mother   . Stroke Father   . Diabetes Brother   . Dementia Sister    Allergies  Allergen Reactions  . Daypro [Oxaprozin] Swelling and Rash    Face, mouth, tongue swelling  . Ibuprofen Swelling and Rash    Allergic to ALL anti-inflammatory medications, including excedrine, aleve  . Naproxen Sodium Swelling and Rash    OBJECTIVE: Blood pressure (!) 144/71, pulse 86, temperature 98 F (36.7 C), temperature source Oral, resp. rate 18, height '5\' 10"'$  (1.778 m), weight 83.9 kg, SpO2 99 %.  Physical Exam Constitutional:      General: She is not in acute distress. HENT:     Head: Normocephalic and atraumatic.     Nose: Nose normal.  Eyes:     General:        Right eye: No discharge.        Left eye: No discharge.     Extraocular Movements: Extraocular movements intact.  Neck:     Musculoskeletal: No neck rigidity.  Cardiovascular:     Rate and Rhythm: Normal rate and regular rhythm.  Pulmonary:     Effort: Pulmonary effort is normal.  No respiratory distress.     Breath sounds: No wheezing.  Abdominal:     General: There is no distension.  Neurological:     General: No focal deficit present.     Mental Status: She is alert.  Psychiatric:        Mood and Affect: Affect normal.        Speech: Speech is delayed.        Behavior: Behavior is slowed.        Thought Content: Thought content normal.        Cognition and Memory: Memory is impaired.    Left knee with bandage  Lab Results Lab Results  Component Value Date   WBC 10.0 11/13/2018  HGB 11.1 (L) 11/13/2018   HCT 35.7 (L) 11/13/2018   MCV 97.0 11/13/2018   PLT 198 11/13/2018    Lab Results  Component Value Date   CREATININE 0.89 11/13/2018   BUN 13 11/13/2018   NA 134 (L) 11/13/2018   K 4.4 11/13/2018   CL 100 11/13/2018   CO2 25 11/13/2018    Lab Results  Component Value Date   ALT 22 11/11/2018   AST 44 (H) 11/11/2018   ALKPHOS 44 11/11/2018   BILITOT 1.5 (H) 11/11/2018     Microbiology: Recent Results (from the past 240 hour(s))  SARS CORONAVIRUS 2 (TAT 6-24 HRS) Nasopharyngeal Nasopharyngeal Swab     Status: None   Collection Time: 11/10/18 10:32 PM   Specimen: Nasopharyngeal Swab  Result Value Ref Range Status   SARS Coronavirus 2 NEGATIVE NEGATIVE Final    Comment: (NOTE) SARS-CoV-2 target nucleic acids are NOT DETECTED. The SARS-CoV-2 RNA is generally detectable in upper and lower respiratory specimens during the acute phase of infection. Negative results do not preclude SARS-CoV-2 infection, do not rule out co-infections with other pathogens, and should not be used as the sole basis for treatment or other patient management decisions. Negative results must be combined with clinical observations, patient history, and epidemiological information. The expected result is Negative. Fact Sheet for Patients: SugarRoll.be Fact Sheet for Healthcare Providers: https://www.woods-mathews.com/  This test is not yet approved or cleared by the [place] FDA and  has been authorized for detection and/or diagnosis of SARS-CoV-2 by FDA under an Emergency Use Authorization (EUA). This EUA will remain  in effect (meaning this test can be used) for the duration of the COVID-19 declaration under Section 56 4(b)(1) of the Act, 21 U.S.C. section 360bbb-3(b)(1), unless the authorization is terminated or revoked sooner. Performed at [facility], [place] [address] [place], [place] [address]   Culture, blood (routine x 2)     Status: None (Preliminary result)   Collection Time: 11/11/18  4:21 PM   Specimen: BLOOD LEFT HAND  Result Value Ref Range Status   Specimen Description   Final    BLOOD LEFT HAND Performed at [facility] [address] [place], [place] [address]    Special Requests   Final    BOTTLES DRAWN AEROBIC AND ANAEROBIC Blood Culture adequate volume Performed at [facility] [address], [place], [place] [address]    Culture   Final    NO GROWTH 2 DAYS Performed at [facility] [address] [place], [place] [address]    Report Status PENDING  Incomplete  Culture, blood (routine x 2)     Status: None (Preliminary result)   Collection Time: 11/11/18  4:25 PM   Specimen: BLOOD RIGHT HAND  Result Value Ref Range Status   Specimen Description   Final    BLOOD RIGHT HAND Performed at [facility] [address] [place], [place] [address]    Special Requests   Final    BOTTLES DRAWN AEROBIC AND ANAEROBIC Blood Culture adequate volume Performed at [facility] [address], [place], [place] [address]    Culture   Final    NO GROWTH 2 DAYS Performed at [facility] [address] [place], [place] [address]    Report Status PENDING  Incomplete  Surgical pcr screen     Status: Abnormal   Collection Time: 11/12/18 11:46 PM   Specimen: Nasal Mucosa; Nasal  Swab   Result Value Ref Range Status   MRSA, PCR NEGATIVE NEGATIVE Final   Staphylococcus aureus POSITIVE (A) NEGATIVE Final    Comment: (NOTE) The Xpert SA Assay (FDA approved for NASAL specimens in patients 69 years of age and older), is one component of a comprehensive surveillance program. It is not intended to diagnose infection nor to guide or monitor treatment. Performed at [facility], [place] [address] [place], [place] [address]   Aerobic/Anaerobic Culture (surgical/deep wound)     Status: None (Preliminary result)   Collection Time: 11/13/18 11:07 AM   Specimen: Synovium  Result Value Ref Range Status   Specimen Description   Final    SYNOVIAL LEFT KNEE Performed at [facility] [address], [place], [place] [address]    Special Requests   Final    NONE Performed at [facility], [place] [address] [place], [place] [address]    Gram Stain   Final    ABUNDANT WBC PRESENT, PREDOMINANTLY PMN FEW GRAM POSITIVE COCCI IN PAIRS Performed at [facility], [place] [address], [place], [place] [address]    Culture PENDING  Incomplete   Report Status PENDING  Incomplete    Alcide Evener, [facility] [phone] pager  11/13/2018, 6:55 PM

## 2018-11-13 NOTE — Anesthesia Postprocedure Evaluation (Signed)
Anesthesia Post Note  Patient: Therapist, music  Procedure(s) Performed: EXCISIONAL TOTAL KNEE ARTHROPLASTY WITH ANTIBIOTIC SPACERS (Left Knee)     Patient location during evaluation: PACU Anesthesia Type: Spinal Level of consciousness: awake and alert Pain management: pain level controlled Vital Signs Assessment: post-procedure vital signs reviewed and stable Respiratory status: spontaneous breathing and respiratory function stable Cardiovascular status: blood pressure returned to baseline and stable Postop Assessment: no headache, no backache, spinal receding and no apparent nausea or vomiting Anesthetic complications: no    Last Vitals:  Vitals:   11/13/18 1456 11/13/18 1557  BP: (!) 150/62 (!) 144/74  Pulse: 70 82  Resp: 16 16  Temp: 36.8 C 36.4 C  SpO2: 100% 100%    Last Pain:  Vitals:   11/13/18 1636  TempSrc:   PainSc: 9                  Montez Hageman

## 2018-11-13 NOTE — Progress Notes (Signed)
Telephone consent obtained from husband for PICC placement ,noted ID  Has been consulted. Will place PICC once seen by ID.

## 2018-11-13 NOTE — Progress Notes (Signed)
Patient A X O X 4 this shift, periods of in and out of confusion. Was able to obtain consent for surgery. Patient verbalized understanding of surgery. Note stated MD explained procedure to patient and husband. Patient NPO past 12a. Pre- surgery Ensure administered and CHG complete.   MRSA - and Staph PCR + per lab tech. On call notified. No c/o from patient. Will continue to monitor.

## 2018-11-13 NOTE — Evaluation (Addendum)
Physical Therapy Evaluation Patient Details Name: Kimberly Miller MRN: [id number] DOB: [birth year] Today's Date: 11/13/2018   History of Present Illness  Patient is 78 y.o. female s/p Lt TKA wthi antibiotic spacer on 11/13/18 with PMH significant for stroke, CAD, DM, and OA.    Clinical Impression  Kimberly Miller is a 78 y.o. female POD 0 s/p Lt TKA with antibiotic spacer. Patient is questionable historian and no family present to confirm PLOF. Patient reports she was mobilizing with SPC until one day she couldn't walk and is uncertain of when this began. Patient is now limited by functional impairments (see PT problem list below) and requires mod-max assist with 2 person to complete supine<>sit transfers. Patient was limited greatly by Lt knee pain today and poor quad activation and sit to stand was not attempted. Patient instructed in exercise to facilitate circulation. Patient will benefit from continued skilled PT interventions to address impairments and progress towards PLOF. Acute PT will follow to progress mobility in preparation for safe discharge. Currently recommending SNF.     Follow Up Recommendations SNF    Equipment Recommendations  None recommended by PT    Recommendations for Other Services       Precautions / Restrictions Precautions Precautions: Fall Restrictions Weight Bearing Restrictions: Yes LLE Weight Bearing: Partial weight bearing LLE Partial Weight Bearing Percentage or Pounds: 50%      Mobility  Bed Mobility Overal bed mobility: Needs Assistance Bed Mobility: Supine to Sit;Sit to Supine     Supine to sit: Max assist;+2 for safety/equipment;+2 for physical assistance;HOB elevated Sit to supine: Max assist;+2 for safety/equipment;+2 for physical assistance;HOB elevated   General bed mobility comments: pt required verbal/tactile cues complete bed mobility, cue needed to reach for bed rails and assit with raising trunk, and assist required for LE mobility. pt  limited by Lt knee pain and reported increase in pain with bending her knee to sit EOB.  Transfers          General transfer comment: NT due to pain  Ambulation/Gait       Stairs            Wheelchair Mobility    Modified Rankin (Stroke Patients Only)       Balance Overall balance assessment: Needs assistance Sitting-balance support: Bilateral upper extremity supported;Feet supported Sitting balance-Leahy Scale: Fair         Standing balance comment: NT          Pertinent Vitals/Pain Pain Assessment: Faces Faces Pain Scale: Hurts whole lot Pain Location: Lt knee Pain Descriptors / Indicators: Aching;Sore Pain Intervention(s): Limited activity within patient's tolerance;Monitored during session;Repositioned    Home Living Family/patient expects to be discharged to:: Skilled nursing facility Living Arrangements: Spouse/significant other Available Help at Discharge: Family Type of Home: House       Home Layout: One level Home Equipment: Environmental consultant - 2 wheels;Walker - 4 wheels;Bedside commode;Grab bars - toilet;Grab bars - tub/shower;Cane - single point Additional Comments: pt questionable historian and no family present to confirm; home environment from chart and pt report combined    Prior Function Level of Independence: Independent with assistive device(s)         Comments: pt reports she was using Twin to mobilize prior to the wednesday when she just couldn't walk, pt uncertain of how long ago it was that she stopped being able to walk     Hand Dominance   Dominant Hand: Right    Extremity/Trunk Assessment   Upper Extremity Assessment Upper  Extremity Assessment: Generalized weakness    Lower Extremity Assessment Lower Extremity Assessment: Generalized weakness;LLE deficits/detail LLE Deficits / Details: pt limited by pain with Lt knee, pt with poor quad activation in supine and unable to complete LAQ while sitting EOB LLE: Unable to fully  assess due to pain LLE Sensation: WNL LLE Coordination: decreased gross motor    Cervical / Trunk Assessment Cervical / Trunk Assessment: Kyphotic  Communication   Communication: No difficulties  Cognition Arousal/Alertness: Awake/alert Behavior During Therapy: WFL for tasks assessed/performed Overall Cognitive Status: No family/caregiver present to determine baseline cognitive functioning            General Comments: pt with poor short term memory and requried repeated simple single step cues to sequence bed mobility appropriately. pt with slow processing requiring extra time to follow commands      General Comments      Exercises Total Joint Exercises Ankle Circles/Pumps: Seated;10 reps;AROM;Both   Assessment/Plan    PT Assessment Patient needs continued PT services  PT Problem List Decreased strength;Decreased balance;Decreased mobility;Decreased range of motion;Decreased activity tolerance;Decreased knowledge of use of DME;Decreased coordination;Decreased cognition;Decreased knowledge of precautions;Pain       PT Treatment Interventions DME instruction;Functional mobility training;Balance training;Patient/family education;Modalities;Gait training;Therapeutic activities;Therapeutic exercise;Stair training    PT Goals (Current goals can be found in the Care Plan section)  Acute Rehab PT Goals Patient Stated Goal: to improve strength and walking PT Goal Formulation: With patient Time For Goal Achievement: 11/20/18 Potential to Achieve Goals: Fair    Frequency 7X/week    AM-PAC PT "6 Clicks" Mobility  Outcome Measure Help needed turning from your back to your side while in a flat bed without using bedrails?: A Lot Help needed moving from lying on your back to sitting on the side of a flat bed without using bedrails?: A Lot Help needed moving to and from a bed to a chair (including a wheelchair)?: Total Help needed standing up from a chair using your arms (e.g.,  wheelchair or bedside chair)?: Total Help needed to walk in hospital room?: Total Help needed climbing 3-5 steps with a railing? : Total 6 Click Score: 8    End of Session Equipment Utilized During Treatment: Gait belt Activity Tolerance: Patient limited by pain Patient left: in bed;with call bell/phone within reach;with bed alarm set Nurse Communication: Mobility status PT Visit Diagnosis: Muscle weakness (generalized) (M62.81);Difficulty in walking, not elsewhere classified (R26.2);Pain Pain - Right/Left: Left Pain - part of body: Knee    Time: 2878-6767 PT Time Calculation (min) (ACUTE ONLY): 25 min   Charges:   PT Evaluation $PT Eval Moderate Complexity: 1 Mod          Valentino Saxon, PT, DPT Physical Therapist with [facility]  11/13/2018 6:59 PM

## 2018-11-13 NOTE — Interval H&P Note (Signed)
History and Physical Interval Note:  11/13/2018 9:13 AM  Kimberly Miller  has presented today for surgery, with the diagnosis of infected left knee.  The various methods of treatment have been discussed with the patient and family. After consideration of risks, benefits and other options for treatment, the patient has consented to  Procedure(s): EXCISIONAL TOTAL KNEE ARTHROPLASTY WITH ANTIBIOTIC SPACERS (Left) as a surgical intervention.  The patient's history has been reviewed, patient examined, no change in status, stable for surgery.  I have reviewed the patient's chart and labs.  Questions were answered to the patient's satisfaction.     Mauri Pole

## 2018-11-13 NOTE — Op Note (Signed)
Kimberly Miller, Kimberly Miller MEDICAL RECORD [id number] ACCOUNT [id number] DATE OF BIRTH:1940-03-25 FACILITY: [facility] LOCATION: [facility] PHYSICIAN:Erubiel Manasco Rosalia Hammers, MD  OPERATIVE REPORT  DATE OF PROCEDURE:  11/13/2018  PREOPERATIVE DIAGNOSIS:  Infected left total knee replacement.  POSTOPERATIVE DIAGNOSIS:  Infected left total knee replacement.  PROCEDURES:   1.  Resection of left total knee replacement. 2.  Placement of antibiotic spacer.  COMPONENTS USED:  The components used were a size 5 left femoral component and a size 5 x 17.5 polyethylene insert.  It was cemented into place with 3 g of vancomycin and 3.6 g of tobramycin.  The wound itself was sprinkled with 1 g of vancomycin on the  way out.  SURGEON:  Durene Romans, MD  ASSISTANT:  Dennie Bible PA-C.  Note that Kimberly Miller was present for the entirety of the case from preoperative positioning, perioperative management of the operative extremity, general facilitation of the case and primary wound closure   ANESTHESIA:  Spinal.  SPECIMENS:  Joint fluid was taken as a 20 mL syringe, as well as the tissues from the medial and lateral gutters and synovium for evaluation, Gram stain and culture.  DRAINS:  None.  TOURNIQUET:  Up for 44 minutes at 250 mmHg.  ESTIMATED BLOOD LOSS:  Less  than 200 mL.  COMPLICATIONS:  None apparent.  INDICATIONS:  The patient is a 78 year old female with history of an index left total knee replacement greater than 3 years ago.  Her history revealed problems and challenges in regard to pain in the postoperative period with workup for infection being  negative back during that period within the first year after surgery.  She subsequently had a period of time where she was doing better, but presented to the emergency room at an outside facility a few days ago with increasing pain.  No fever reported.   Based on her relationship with Dr. Teryl Lucy, she was transferred to [facility].  Dr. Dion Saucier asked  for my assistance.  She was noted to have an elevated C-reactive protein and sedimentation rate available in the hospital Epic system, as well as a  knee aspirate that was cloudy.  The initial results revealed greater than 21,000 white cells.  Culture was still pending.  It was a positive alpha defensin test.  Given these findings and her clinical presentation, the diagnosis of an infected left total  knee replacement was made readily.  The need and necessity for 2-stage treatment was discussed and reviewed with the patient and her husband.  Consent was obtained for this first stage of resection and placement of an antibiotic articulating spacer.   Consent was obtained.  PROCEDURE IN DETAIL:  The patient was brought to the operative theater.  Once adequate anesthesia, preoperative antibiotics initially held, despite the fact that she had already been receiving Rocephin on the floor, we did not get intraoperative cultures  until I obtained synovial fluid and tissue.  The left lower extremity was placed in a thigh tourniquet.  Left lower extremity was then prepped and draped in sterile fashion.  A timeout was performed, identifying the patient, the planned procedure and  extremity.  The leg was then exsanguinated, tourniquet elevated to 250 mmHg.  Her old incision was excised and extended slightly proximal and distal for exposure in the revision setting.  Median arthrotomy was made, encountering pus and purulent fluid  inside the knee.  We used a syringe and aspirated this fluid and sent it to pathology for Gram stain analysis.  Following  initial arthrotomy,   exposure was carried out, performing medial and lateral synovectomies.  Again, tissue from the medial and  lateral aspect of the joint was sent.  With the knee fully exposed, we removed the polyethylene by removing the locking mechanism, followed by removing the poly.  We then used a thin ACL saw and undermined the bone cement interface on the tibia and  the  femur and removed the 2 components without significant bone loss.  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  There was no significant bone loss, but the bone was noted to be very soft.  Care was taken at this point  to protect the bone as much as possible.  I then hand reamed with DePuy reamers up to 16 mm.  We then irrigated the canal with a canal brush, irrigated with about a liter of solution, both in the distal femur and in the proximal tibia.  We then used the  remaining with pulse lavage  on the knee.  I sized the femoral component to be a size 5.  We then also assessed the extension gap and selected a 17.5 mm insert.  Once we had irrigated the knee and following final debridements,  I removed the patella  button.  We then placed IrriSept chlorhexidine fluid into the joint and let it sit for 5 minutes per protocol.  Once this was done, the cement was mixed, 3 batches of cement with 3 g of vancomycin and 3.6 of tobramycin.  The final components were then  cemented onto the end of the femur and proximal tibia.  I did use a saw to create ridges within the posterior aspect of the tibial component to allow for cement interdigitation.  I did place some cement into the canal as well.  The knee was brought to  extension to allow the cement to cure.  While this was being carried out, another 3 L of normal saline solution was irrigated into the knee.  Once the cement fully cured and we finished our irrigation, the knee was brought to 30-40 degrees of flexion and  extensor mechanism reapproximated using #1 Vicryl and #1 Stratafix suture.  The remainder of the wound was closed with 2-0 Vicryl and a running Monocryl stitch.  The knee was cleaned, dried and dressed sterilely using surgical glue and Aquacel dressing.   No drain was placed.  She was then brought to the recovery room in stable condition, tolerating the procedure well.  Findings were reviewed with her husband.    Postoperatively, she will receive a PICC line for IV antibiotic use for 6 weeks.  Infectious disease will be consulted for assistance in treating this infection.  VN/NUANCE  D:11/13/2018 T:11/13/2018 JOB:[id number]

## 2018-11-13 NOTE — Progress Notes (Signed)
Pharmacy Antibiotic Note  Kimberly Miller is a 78 y.o. female admitted on 11/10/2018 with L septic knee.  Pharmacy has been consulted for vancomycin dosing. Severe left knee pain with history of total knee replacement, infection versus gout.    11/13/2018  Infected L TKR> OR 11/5> explantation & cement interposition of L TKR.  Vanc 1 gm given in OR at 1117 after OR cultures done Ancef 2 gm given in OR after OR cultures done  Plan: resume vancomycin dose 1500mg  IV q24h est AUC 517.8, Scr 0.94 Ceftriaxone 2 gm IV q24 per MD F/u renal function, WBC, temp, culture data Vancomycin levels as needed   Height: 5\' 10"  (177.8 cm) Weight: 185 lb (83.9 kg) IBW/kg (Calculated) : 68.5  Temp (24hrs), Avg:98.7 F (37.1 C), Min:97.6 F (36.4 C), Max:101.1 F (38.4 C)  Recent Labs  Lab 11/11/18 0156 11/11/18 1621 11/11/18 1912 11/12/18 0456 11/13/18 0538  WBC 21.2*  --   --   --  10.0  CREATININE 0.75  --   --  0.94 0.89  LATICACIDVEN  --  1.2 1.3  --   --     Estimated Creatinine Clearance: 60.4 mL/min (by C-G formula based on SCr of 0.89 mg/dL).    Allergies  Allergen Reactions  . Daypro [Oxaprozin] Swelling and Rash    Face, mouth, tongue swelling  . Ibuprofen Swelling and Rash    Allergic to ALL anti-inflammatory medications, including excedrine, aleve  . Naproxen Sodium Swelling and Rash    Antimicrobials this admission: 11/3 Vanc>> 11/4   11/5>>> 11/3 zosyn>> 11/4 11/4 CTX >> Dose adjustments this admission:  Microbiology results: 11/3 BCx2: ngtd 11/3 L knee fluid(cx @ ortho office)>> 11/5 L knee synovial fluid (OR) >> few gram positive cocci in pairs 11/4 MRSA PCR neg 11/4 SA PCR +  Thank you for allowing pharmacy to be a part of this patient's care.  Eudelia Bunch, Pharm.D [phone] 11/13/2018 4:11 PM

## 2018-11-13 NOTE — Progress Notes (Signed)
Patient ID: Kimberly Miller, female   DOB: [birth year], 78 y.o.   MRN: [id number] Subjective: Day of Surgery Procedure(s) (LRB): EXCISIONAL TOTAL KNEE ARTHROPLASTY WITH ANTIBIOTIC SPACERS (Left)    Patient reports pain as moderate.  No events.  Getting ready for OR this am  Objective:   VITALS:   Vitals:   11/12/18 2230 11/13/18 0432  BP:  (!) 153/85  Pulse:  71  Resp:  16  Temp: 98.5 F (36.9 C) 97.7 F (36.5 C)  SpO2:  100%    Left knee exam unchanged  LABS Recent Labs    11/11/18 0156 11/13/18 0538  HGB 13.0 11.1*  HCT 40.0 35.7*  WBC 21.2* 10.0  PLT 242 198    Recent Labs    11/11/18 0156 11/12/18 0456 11/13/18 0538  NA 128*  --  134*  K 3.7  --  4.4  BUN 15  --  13  CREATININE 0.75 0.94 0.89  GLUCOSE 277*  --  265*    No results for input(s): LABPT, INR in the last 72 hours.   Assessment/Plan: Day of Surgery Procedure(s) (LRB): EXCISIONAL TOTAL KNEE ARTHROPLASTY WITH ANTIBIOTIC SPACERS (Left)  Plan: NPO Consent on chart Getting prepped for OR today for resection of infected left total knee placement of antibiotic spacer PIC line  ID consult for assistance

## 2018-11-13 NOTE — Anesthesia Procedure Notes (Addendum)
Spinal  Patient location during procedure: OR Start time: 11/13/2018 10:27 AM End time: 11/13/2018 10:31 AM Reason for block: at surgeon's request Staffing Resident/CRNA: Anne Fu, CRNA Performed: resident/CRNA  Preanesthetic Checklist Completed: patient identified, site marked, surgical consent, pre-op evaluation, timeout performed, IV checked, risks and benefits discussed and monitors and equipment checked Spinal Block Patient position: sitting Prep: DuraPrep Patient monitoring: heart rate, continuous pulse ox and blood pressure Approach: midline Location: L2-3 Injection technique: single-shot Needle Needle type: Pencan  Needle gauge: 24 G Needle length: 9 cm Assessment Sensory level: T6 Additional Notes  Functioning IV was confirmed and monitors were applied. Expiration date of kit checked and confirmed. Sterile prep and drape, including hand hygiene and sterile gloves were used. The patient was positioned and the spine was prepped. The skin was anesthetized with lidocaine.  Free flow of clear CSF was obtained prior to injecting local anesthetic into the CSF X 1 attempt.  The spinal needle aspirated freely following injection.  The needle was carefully withdrawn. Patient tolerated procedure well, without complications. Loss of motor and sensory on exam post injection.

## 2018-11-14 ENCOUNTER — Encounter (HOSPITAL_COMMUNITY): Payer: Self-pay | Admitting: Orthopedic Surgery

## 2018-11-14 LAB — GLUCOSE, CAPILLARY
Glucose-Capillary: 184 mg/dL — ABNORMAL HIGH (ref 70–99)
Glucose-Capillary: 222 mg/dL — ABNORMAL HIGH (ref 70–99)
Glucose-Capillary: 243 mg/dL — ABNORMAL HIGH (ref 70–99)
Glucose-Capillary: 266 mg/dL — ABNORMAL HIGH (ref 70–99)

## 2018-11-14 LAB — BASIC METABOLIC PANEL
Anion gap: 9 (ref 5–15)
BUN: 12 mg/dL (ref 8–23)
CO2: 24 mmol/L (ref 22–32)
Calcium: 8 mg/dL — ABNORMAL LOW (ref 8.9–10.3)
Chloride: 101 mmol/L (ref 98–111)
Creatinine, Ser: 0.89 mg/dL (ref 0.44–1.00)
GFR calc Af Amer: >60 mL/min (ref >60)
GFR calc non Af Amer: >60 mL/min (ref >60)
Glucose, Bld: 234 mg/dL — ABNORMAL HIGH (ref 70–99)
Potassium: 4.3 mmol/L (ref 3.5–5.1)
Sodium: 134 mmol/L — ABNORMAL LOW (ref 135–145)

## 2018-11-14 LAB — SEDIMENTATION RATE: Sed Rate: 90 mm/hr — ABNORMAL HIGH (ref 0–22)

## 2018-11-14 LAB — CBC
HCT: 32 % — ABNORMAL LOW (ref 36.0–46.0)
Hemoglobin: 9.8 g/dL — ABNORMAL LOW (ref 12.0–15.0)
MCH: 30.2 pg (ref 26.0–34.0)
MCHC: 30.6 g/dL (ref 30.0–36.0)
MCV: 98.5 fL (ref 80.0–100.0)
Platelets: 171 10*3/uL (ref 150–400)
RBC: 3.25 MIL/uL — ABNORMAL LOW (ref 3.87–5.11)
RDW: 14.8 % (ref 11.5–15.5)
WBC: 7.6 10*3/uL (ref 4.0–10.5)
nRBC: 0 % (ref 0.0–0.2)

## 2018-11-14 LAB — C-REACTIVE PROTEIN: CRP: 25.7 mg/dL — ABNORMAL HIGH (ref <1.0)

## 2018-11-14 LAB — SURGICAL PATHOLOGY

## 2018-11-14 MED ORDER — INSULIN ASPART 100 UNIT/ML ~~LOC~~ SOLN
2.0000 [IU] | Freq: Three times a day (TID) | SUBCUTANEOUS | Status: DC
  Administered 2018-11-14 – 2018-11-15 (×4): 2 [IU] via SUBCUTANEOUS

## 2018-11-14 MED ORDER — INSULIN GLARGINE 100 UNIT/ML ~~LOC~~ SOLN
32.0000 [IU] | Freq: Every day | SUBCUTANEOUS | Status: DC
  Administered 2018-11-14: 32 [IU] via SUBCUTANEOUS
  Filled 2018-11-14 (×2): qty 0.32

## 2018-11-14 NOTE — Progress Notes (Signed)
Physical Therapy Treatment Patient Details Name: Kimberly Miller MRN: [id number] DOB: 10-29-1940 Today's Date: 11/14/2018    History of Present Illness Patient is 78 y.o. female s/p Lt TKA wthi antibiotic spacer on 11/13/18 with PMH significant for stroke, CAD, DM, and OA.    PT Comments    Patient received in bed, initially attempting to refuse PT, stating "they are coming to give me a bath"; education provided regarding importance of PT given multiple knee surgeries, and repeated throughout session. Cognition definitely impaired today- see A&O section for details. Worked on functional TKR exercises in bed with Max VC and TC, patient often saying "hold on" and staring off into space, requiring MaxA and cues for participation. Even basic bed exercises limited by pain today. Attempted bed mobility but patient groaning and grimacing, yelling in pain and physically resisting. Will attempt further when +2 help is available this PM. She was left in bed with all needs met, bed alarm active this morning.     Follow Up Recommendations  SNF     Equipment Recommendations  None recommended by PT    Recommendations for Other Services       Precautions / Restrictions Precautions Precautions: Fall Restrictions Weight Bearing Restrictions: Yes LLE Weight Bearing: Weight bearing as tolerated LLE Partial Weight Bearing Percentage or Pounds: 50%    Mobility  Bed Mobility               General bed mobility comments: attempted, patient yelling in pain and physically resisting making bed mobility impossible with +1 assist- will attempt in PM session  Transfers                 General transfer comment: NT- pain and need for +2  Ambulation/Gait             General Gait Details: NT- pain and safety   Stairs             Wheelchair Mobility    Modified Rankin (Stroke Patients Only)       Balance Overall balance assessment: Needs assistance Sitting-balance support:  Bilateral upper extremity supported;Feet supported Sitting balance-Leahy Scale: Fair         Standing balance comment: NT                            Cognition Arousal/Alertness: Awake/alert Behavior During Therapy: Flat affect Overall Cognitive Status: No family/caregiver present to determine baseline cognitive functioning Area of Impairment: Orientation;Attention;Memory;Following commands;Safety/judgement;Awareness;Problem solving                 Orientation Level: Disoriented to;Time Current Attention Level: Sustained Memory: Decreased short-term memory Following Commands: Follows one step commands inconsistently;Follows one step commands with increased time Safety/Judgement: Decreased awareness of safety Awareness: Intellectual Problem Solving: Slow processing;Decreased initiation;Difficulty sequencing;Requires verbal cues;Requires tactile cues General Comments: poor short term memory, extended time to process, often required repeated commands and about 50% of the time needed Max TC to participate      Exercises Total Joint Exercises Goniometric ROM: L knee extension approximately 12 degrees in supine, attempted to measure flexion but patient unable to tolerate position long enough to get measurement. Would estimate maybe 20-30 degrees flexion.    General Comments        Pertinent Vitals/Pain Pain Assessment: 0-10 Pain Score: 9  Pain Location: Lt knee Pain Descriptors / Indicators: Aching;Sharp;Guarding Pain Intervention(s): Limited activity within patient's tolerance;Monitored during session    Home Living  Prior Function            PT Goals (current goals can now be found in the care plan section) Acute Rehab PT Goals Patient Stated Goal: to improve strength and walking PT Goal Formulation: With patient Time For Goal Achievement: 11/20/18 Potential to Achieve Goals: Fair Progress towards PT goals: Not progressing  toward goals - comment(pain and cognition)    Frequency    7X/week      PT Plan Current plan remains appropriate    Co-evaluation              AM-PAC PT "6 Clicks" Mobility   Outcome Measure  Help needed turning from your back to your side while in a flat bed without using bedrails?: A Lot Help needed moving from lying on your back to sitting on the side of a flat bed without using bedrails?: Total Help needed moving to and from a bed to a chair (including a wheelchair)?: Total Help needed standing up from a chair using your arms (e.g., wheelchair or bedside chair)?: Total Help needed to walk in hospital room?: Total Help needed climbing 3-5 steps with a railing? : Total 6 Click Score: 7    End of Session   Activity Tolerance: No increased pain Patient left: in bed;with call bell/phone within reach;with bed alarm set   PT Visit Diagnosis: Muscle weakness (generalized) (M62.81);Difficulty in walking, not elsewhere classified (R26.2);Pain Pain - Right/Left: Left Pain - part of body: Knee     Time: 7425-9563 PT Time Calculation (min) (ACUTE ONLY): 23 min  Charges:  $Therapeutic Exercise: 8-22 mins $Self Care/Home Management: 8-22                     Windell Norfolk, DPT, CBIS  Supplemental Physical Therapist [place]    Pager [phone] Acute Rehab Office [phone]

## 2018-11-14 NOTE — Care Management Important Message (Signed)
Important Message  Patient Details IM Letter given to Velva Harman RN to present to the Patient Name: Kimberly Miller MRN: [id number] Date of Birth: [birth year]   Medicare Important Message Given:  Yes     Kerin Salen 11/14/2018, 11:35 AM

## 2018-11-14 NOTE — Progress Notes (Signed)
Culture from knee aspirate from [facility] demonstrated "presumptive staphylococcus species".    Sensitivities pending.   Johnny Bridge, MD

## 2018-11-14 NOTE — TOC Progression Note (Signed)
Transition of Care [facility]) - Progression Note    Patient Details  Name: Kimberly Miller MRN: [id number] Date of Birth: 1940-09-18  [facility] of [facility]) CM/SW Contact  Leeroy Cha, RN Phone Number: 11/14/2018, 1:42 PM  Clinical Narrative:    0900/ pam chandler with [facility] notified of possible home iv abx.  Will follow        Expected Discharge Plan and Services                                                 Social Determinants of Health (SDOH) Interventions    Readmission Risk Interventions No flowsheet data found.

## 2018-11-14 NOTE — Progress Notes (Signed)
PROGRESS NOTE    Kimberly Miller  OXB:[id number] DOB: 10-03-1940 DOA: 11/10/2018 PCP: Aniceto Boss, MD    Brief Narrative:  78 year old lady with prior history of left total knee replacement, diabetes mellitus, CVA, history of coronary artery disease was admitted to the hospital for left knee pain and swelling.  X-rays of the knee does not show any fracture, it was followed with a CT showing effusion.  She was seen by orthopedic surgeon and underwent aspiration of the knee on 11/3.  She was scheduled for removal of possible infected hardware.  Patient underwent removal of the infected hardware in total knee arthroplasty with antibiotic spacers on the left on 11/13/2018 by Dr. Charlann Boxer.  Cultures from the knee aspirate grew staph species and patient is currently on vancomycin and Rocephin.  ID consulted by orthopedics and recommended to continue with vancomycin and Rocephin.  Assessment & Plan:   Active Problems:   Knee pain, left   Type 2 diabetes mellitus without complication, with long-term current use of insulin (HCC)   Cognitive impairment   ASVD (arteriosclerotic vascular disease)   Acute pain of left knee   Left knee pain/possible septic arthritis Orthopedics consulted and underwent removal of infected hardware and total knee arthroplasty by orthopedics on 11/13/18. Continue with IV antibiotics (IV vancomycin and IV Rocephin ). ID consulted and recommended at least 6 weeks of IV antibiotics. PT evaluation recommending SNF. Culture from the knee aspirate showed staph species, final sensitivities are pending CRP is 25.7 and sed rate is 90.  Patient is afebrile this morning and WBC count within normal limits. HIV screen is negative  Insulin-dependent diabetes mellitus  CBG (last 3)  Recent Labs    11/13/18 2128 11/14/18 0740 11/14/18 1226  GLUCAP 246* 184* 222*   Increase Lantus to 32 units and continue with sliding scale insulin, add 2 units of NovoLog 3 times daily AC.     Anemia of blood loss from surgery superimposed on mild anemia of chronic disease  Baseline hemoglobin around 11, hemoglobin this morning and 9.8 probably slight blood loss from surgery. Continue to monitor and transfuse to keep hemoglobin greater than 7.   Hypertension:  Well controlled on lisinopril.      DVT prophylaxis: Lovenox Code Status: Full code Family Communication: None at bedside  disposition Plan: Pending further evaluation  Consultants:   Orthopedics  Procedures: Left total knee arthroplasty with antibiotic spacer placement Antimicrobials: Vancomycin and Rocephin     Subjective: No new complaints at this time  Objective: Vitals:   11/14/18 0454 11/14/18 0629 11/14/18 0931 11/14/18 0932  BP: 137/72  (!) 127/56   Pulse: (!) 109 97 86 89  Resp: 20  16   Temp: 100 F (37.8 C) 98.5 F (36.9 C) 98.9 F (37.2 C)   TempSrc:  Oral Oral   SpO2: 97% 94%  95%  Weight:      Height:        Intake/Output Summary (Last 24 hours) at 11/14/2018 1253 Last data filed at 11/14/2018 0913 Gross per 24 hour  Intake 2728.98 ml  Output 1000 ml  Net 1728.98 ml   Filed Weights   11/11/18 0126  Weight: 83.9 kg    Examination:  General exam: Appears calm and comfortable  Respiratory system: Clear to auscultation. Respiratory effort normal. Cardiovascular system: S1 & S2 heard, RRR. No JVD, murmurs, rubs, gallops or clicks. No pedal edema. Gastrointestinal system: Abdomen is nondistended, soft and nontender. No organomegaly or masses felt. Normal bowel sounds  heard. Central nervous system: Alert and oriented. No focal neurological deficits. Extremities: Left knee bandaged Skin: No rashes, lesions or ulcers Psychiatry:Mood & affect appropriate.     Data Reviewed: I have personally reviewed following labs and imaging studies  CBC: Recent Labs  Lab 11/11/18 0156 11/13/18 0538 11/14/18 0240  WBC 21.2* 10.0 7.6  NEUTROABS 17.3*  --   --   HGB 13.0 11.1* 9.8*   HCT 40.0 35.7* 32.0*  MCV 93.0 97.0 98.5  PLT 242 198 782   Basic Metabolic Panel: Recent Labs  Lab 11/11/18 0156 11/12/18 0456 11/13/18 0538 11/14/18 0240  NA 128*  --  134* 134*  K 3.7  --  4.4 4.3  CL 95*  --  100 101  CO2 23  --  25 24  GLUCOSE 277*  --  265* 234*  BUN 15  --  13 12  CREATININE 0.75 0.94 0.89 0.89  CALCIUM 8.5*  --  8.4* 8.0*   GFR: Estimated Creatinine Clearance: 60.4 mL/min (by C-G formula based on SCr of 0.89 mg/dL). Liver Function Tests: Recent Labs  Lab 11/11/18 0156  AST 44*  ALT 22  ALKPHOS 44  BILITOT 1.5*  PROT 6.3*  ALBUMIN 2.9*   No results for input(s): LIPASE, AMYLASE in the last 168 hours. No results for input(s): AMMONIA in the last 168 hours. Coagulation Profile: No results for input(s): INR, PROTIME in the last 168 hours. Cardiac Enzymes: No results for input(s): CKTOTAL, CKMB, CKMBINDEX, TROPONINI in the last 168 hours. BNP (last 3 results) No results for input(s): PROBNP in the last 8760 hours. HbA1C: No results for input(s): HGBA1C in the last 72 hours. CBG: Recent Labs  Lab 11/13/18 1307 11/13/18 1634 11/13/18 2128 11/14/18 0740 11/14/18 1226  GLUCAP 122* 188* 246* 184* 222*   Lipid Profile: No results for input(s): CHOL, HDL, LDLCALC, TRIG, CHOLHDL, LDLDIRECT in the last 72 hours. Thyroid Function Tests: No results for input(s): TSH, T4TOTAL, FREET4, T3FREE, THYROIDAB in the last 72 hours. Anemia Panel: No results for input(s): VITAMINB12, FOLATE, FERRITIN, TIBC, IRON, RETICCTPCT in the last 72 hours. Sepsis Labs: Recent Labs  Lab 11/11/18 1621 11/11/18 1912  LATICACIDVEN 1.2 1.3    Recent Results (from the past 240 hour(s))  SARS CORONAVIRUS 2 (TAT 6-24 HRS) Nasopharyngeal Nasopharyngeal Swab     Status: None   Collection Time: 11/10/18 10:32 PM   Specimen: Nasopharyngeal Swab  Result Value Ref Range Status   SARS Coronavirus 2 NEGATIVE NEGATIVE Final    Comment: (NOTE) SARS-CoV-2 target nucleic  acids are NOT DETECTED. The SARS-CoV-2 RNA is generally detectable in upper and lower respiratory specimens during the acute phase of infection. Negative results do not preclude SARS-CoV-2 infection, do not rule out co-infections with other pathogens, and should not be used as the sole basis for treatment or other patient management decisions. Negative results must be combined with clinical observations, patient history, and epidemiological information. The expected result is Negative. Fact Sheet for Patients: SugarRoll.be Fact Sheet for Healthcare Providers: https://www.woods-mathews.com/ This test is not yet approved or cleared by the [place] FDA and  has been authorized for detection and/or diagnosis of SARS-CoV-2 by FDA under an Emergency Use Authorization (EUA). This EUA will remain  in effect (meaning this test can be used) for the duration of the COVID-19 declaration under Section 56 4(b)(1) of the Act, 21 U.S.C. section 360bbb-3(b)(1), unless the authorization is terminated or revoked sooner. Performed at [facility], [place],  [place] [id number]   Culture, blood (routine x 2)     Status: None (Preliminary result)   Collection Time: 11/11/18  4:21 PM   Specimen: BLOOD LEFT HAND  Result Value Ref Range Status   Specimen Description   Final    BLOOD LEFT HAND Performed at [facility], [address] [place], [place] [address]    Special Requests   Final    BOTTLES DRAWN AEROBIC AND ANAEROBIC Blood Culture adequate volume Performed at [facility], [address] [place], [place] [address]    Culture   Final    NO GROWTH 2 DAYS Performed at [facility], [address] [place], [place] [address]    Report Status PENDING  Incomplete  Culture, blood (routine x 2)     Status: None (Preliminary result)   Collection Time: 11/11/18  4:25 PM   Specimen: BLOOD  RIGHT HAND  Result Value Ref Range Status   Specimen Description   Final    BLOOD RIGHT HAND Performed at [facility], [address] [place], [place] [address]    Special Requests   Final    BOTTLES DRAWN AEROBIC AND ANAEROBIC Blood Culture adequate volume Performed at [facility], [address] [place], [place] [address]    Culture   Final    NO GROWTH 2 DAYS Performed at [facility], [address] [place], [place] [address]    Report Status PENDING  Incomplete  Surgical pcr screen     Status: Abnormal   Collection Time: 11/12/18 11:46 PM   Specimen: Nasal Mucosa; Nasal Swab  Result Value Ref Range Status   MRSA, PCR NEGATIVE NEGATIVE Final   Staphylococcus aureus POSITIVE (A) NEGATIVE Final    Comment: (NOTE) The Xpert SA Assay (FDA approved for NASAL specimens in patients 79 years of age and older), is one component of a comprehensive surveillance program. It is not intended to diagnose infection nor to guide or monitor treatment. Performed at [facility], [address] [place], [place] [address]   Aerobic/Anaerobic Culture (surgical/deep wound)     Status: None (Preliminary result)   Collection Time: 11/13/18 11:07 AM   Specimen: Synovium  Result Value Ref Range Status   Specimen Description   Final    SYNOVIAL LEFT KNEE Performed at [facility], [address] [place], [place] [address]    Special Requests   Final    NONE Performed at [facility], [address] [place], [place] [address]    Gram Stain   Final    ABUNDANT WBC PRESENT, PREDOMINANTLY PMN FEW GRAM POSITIVE COCCI IN PAIRS Performed at [facility], [address] [place], [place] [address]    Culture MODERATE STAPHYLOCOCCUS AUREUS  Final   Report Status PENDING  Incomplete         Radiology Studies: [place] Ekg Site Rite  Result Date: 11/13/2018 If Site Rite image not  attached, placement could not be confirmed due to current cardiac rhythm.       Scheduled Meds: . Chlorhexidine Gluconate Cloth  6 each Topical Daily  . docusate sodium  100 mg Oral BID  . enoxaparin (LOVENOX) injection  1 mg/kg Subcutaneous Q12H  . feeding supplement (ENSURE ENLIVE)  237 mL Oral BID BM  . ferrous sulfate  325 mg Oral BID WC  . gabapentin  400 mg Oral TID  . insulin aspart  0-15 Units Subcutaneous TID WC  . insulin glargine  30 Units Subcutaneous QHS  . lisinopril  5 mg Oral Daily  . mupirocin ointment  1 application Nasal BID  . polyethylene glycol  17 g Oral BID  . senna-docusate  2 tablet Oral Daily  . sodium chloride flush  3 mL Intravenous Q12H  . traMADol  50 mg Oral Q6H  . vitamin B-12  1,000 mcg Oral Daily   Continuous Infusions: . sodium chloride    . sodium chloride 75 mL/hr at 11/13/18 0000  . sodium chloride 75 mL/hr at 11/13/18 1515  . cefTRIAXone (ROCEPHIN)  IV Stopped (11/12/18 1404)  . vancomycin 1,500 mg (11/13/18 2125)     LOS: 4 days        Kathlen Mody, MD [facility] Pager [phone]  If 7PM-7AM, please contact night-coverage [email or web address] Password [facility] 11/14/2018, 12:53 PM

## 2018-11-14 NOTE — Progress Notes (Signed)
Physical Therapy Treatment Patient Details Name: Kimberly Miller MRN: [id number] DOB: [birth year] Today's Date: 11/14/2018    History of Present Illness Patient is 78 y.o. female s/p Lt TKA wthi antibiotic spacer on 11/13/18 with PMH significant for stroke, CAD, DM, and OA.    PT Comments    Patient received in bed, continuing to report high levels of pain and attempting to refuse PT due to lunch not arriving yet; husband present and assisted in encouraging patient to participate in therapy today. Required MaxAx2 for functional bed mobility, then totalAx2 to perform even partial stand with RW so that nurse tech could switch out bed and BSC. Rested on [facility] for some time with small amount of urination, then again required totalAx2 for partial stand so nurse tech could switch BSC for recliner. Poor compliance with weight bearing precautions during transfers despite extreme amounts of physical assist from staff. TotalAx3 to scoot back in recliner seat. RN and tech educated on need for maximove for transfer back to bed, pad placed under patient in chair. She was positioned to comfort with all needs met, chair alarm active and husband present. Continue to emphatically and enthusiastically recommend SNF moving forward.     Follow Up Recommendations  SNF     Equipment Recommendations  None recommended by PT    Recommendations for Other Services       Precautions / Restrictions Precautions Precautions: Fall Restrictions Weight Bearing Restrictions: Yes LLE Weight Bearing: Partial weight bearing LLE Partial Weight Bearing Percentage or Pounds: 50%    Mobility  Bed Mobility Overal bed mobility: Needs Assistance Bed Mobility: Supine to Sit     Supine to sit: Max assist;+2 for physical assistance;+2 for safety/equipment;HOB elevated     General bed mobility comments: MaxAx2 and extended time for bed mobility, once up able to maintain midline sitting with min guard  Transfers Overall transfer  level: Needs assistance Equipment used: Rolling walker (2 wheeled) Transfers: Sit to/from Stand Sit to Stand: Total assist;+2 physical assistance;+2 safety/equipment         General transfer comment: totalA+2 to even perform partial stand, nurse tech present and switched out bed/BSC and BSC/chair  Ambulation/Gait             General Gait Details: unable   Stairs             Wheelchair Mobility    Modified Rankin (Stroke Patients Only)       Balance Overall balance assessment: Needs assistance Sitting-balance support: Bilateral upper extremity supported;Feet supported Sitting balance-Leahy Scale: Fair     Standing balance support: Bilateral upper extremity supported;During functional activity Standing balance-Leahy Scale: Zero Standing balance comment: completely reliant on support from PT and tech to maintain upright                            Cognition Arousal/Alertness: Awake/alert Behavior During Therapy: Flat affect Overall Cognitive Status: No family/caregiver present to determine baseline cognitive functioning Area of Impairment: Orientation;Attention;Memory;Following commands;Safety/judgement;Awareness;Problem solving                 Orientation Level: Disoriented to;Time Current Attention Level: Sustained Memory: Decreased short-term memory Following Commands: Follows one step commands inconsistently;Follows one step commands with increased time Safety/Judgement: Decreased awareness of safety Awareness: Intellectual Problem Solving: Slow processing;Decreased initiation;Difficulty sequencing;Requires verbal cues;Requires tactile cues General Comments: poor short term memory, extended time to process, often required repeated commands and about 50% of the time needed Max TC  to participate      Exercises Total Joint Exercises Goniometric ROM: L knee extension approximately 12 degrees in supine, attempted to measure flexion but  patient unable to tolerate position long enough to get measurement. Would estimate maybe 20-30 degrees flexion.    General Comments        Pertinent Vitals/Pain Pain Assessment: 0-10 Pain Score: 10-Worst pain ever Pain Location: Lt knee Pain Descriptors / Indicators: Aching;Sharp;Guarding Pain Intervention(s): Limited activity within patient's tolerance;Monitored during session;Premedicated before session    Home Living                      Prior Function            PT Goals (current goals can now be found in the care plan section) Acute Rehab PT Goals Patient Stated Goal: to improve strength and walking PT Goal Formulation: With patient Time For Goal Achievement: 11/20/18 Potential to Achieve Goals: Fair Progress towards PT goals: Progressing toward goals    Frequency    7X/week      PT Plan Current plan remains appropriate    Co-evaluation              AM-PAC PT "6 Clicks" Mobility   Outcome Measure  Help needed turning from your back to your side while in a flat bed without using bedrails?: A Lot Help needed moving from lying on your back to sitting on the side of a flat bed without using bedrails?: Total Help needed moving to and from a bed to a chair (including a wheelchair)?: Total Help needed standing up from a chair using your arms (e.g., wheelchair or bedside chair)?: Total Help needed to walk in hospital room?: Total Help needed climbing 3-5 steps with a railing? : Total 6 Click Score: 7    End of Session Equipment Utilized During Treatment: Gait belt Activity Tolerance: Patient limited by pain Patient left: in chair;with call bell/phone within reach;with chair alarm set Nurse Communication: Mobility status;Need for lift equipment PT Visit Diagnosis: Muscle weakness (generalized) (M62.81);Difficulty in walking, not elsewhere classified (R26.2);Pain Pain - Right/Left: Left Pain - part of body: Knee     Time: 9558-3167 PT Time  Calculation (min) (ACUTE ONLY): 43 min  Charges:   $Therapeutic Activity: 38-52 mins  Windell Norfolk, DPT, CBIS  Supplemental Physical Therapist [place]    Pager [phone] Acute Rehab Office [phone]

## 2018-11-14 NOTE — Progress Notes (Signed)
     Subjective: 1 Day Post-Op Procedure(s) (LRB): EXCISIONAL TOTAL KNEE ARTHROPLASTY WITH ANTIBIOTIC SPACERS (Left)   Seen by Dr. Alvan Dame. Patient reports pain as mild, controlled with medication. No events reported throughout the night. Dr. Alvan Dame discussed the procedure, findings and expectations moving forward.    Objective:   VITALS:   Vitals:   11/14/18 0454 11/14/18 0629  BP: 137/72   Pulse: (!) 109 97  Resp: 20   Temp: 100 F (37.8 C) 98.5 F (36.9 C)  SpO2: 97% 94%    Dorsiflexion/Plantar flexion intact Incision: dressing C/D/I No cellulitis present Compartment soft  LABS Recent Labs    11/13/18 0538 11/14/18 0240  HGB 11.1* 9.8*  HCT 35.7* 32.0*  WBC 10.0 7.6  PLT 198 171    Recent Labs    11/12/18 0456 11/13/18 0538 11/14/18 0240  NA  --  134* 134*  K  --  4.4 4.3  BUN  --  13 12  CREATININE 0.94 0.89 0.89  GLUCOSE  --  265* 234*     Assessment/Plan: 1 Day Post-Op Procedure(s) (LRB): EXCISIONAL TOTAL KNEE ARTHROPLASTY WITH ANTIBIOTIC SPACERS (Left) PICC line order placed ID Consult placed Foley cath d/c'ed Advance diet Up with therapy D/C IV fluids Discharge TBD   Kimberly Miller   PAC  11/14/2018, 8:02 AM

## 2018-11-14 NOTE — Progress Notes (Addendum)
Subjective: No new complaints   Antibiotics:  Anti-infectives (From admission, onward)   Start     Dose/Rate Route Frequency Ordered Stop   11/13/18 2200  vancomycin (VANCOCIN) 1,500 mg in sodium chloride 0.9 % 500 mL IVPB     1,500 mg 250 mL/hr over 120 Minutes Intravenous Every 24 hours 11/13/18 1608     11/13/18 1145  tobramycin (NEBCIN) powder  Status:  Discontinued       As needed 11/13/18 1155 11/13/18 1412   11/13/18 1145  vancomycin (VANCOCIN) powder  Status:  Discontinued       As needed 11/13/18 1155 11/13/18 1412   11/13/18 1059  ceFAZolin (ANCEF) 2-4 GM/100ML-% IVPB    Note to Pharmacy: Vevelyn Royals  : cabinet override      11/13/18 1059 11/13/18 2314   11/13/18 1059  vancomycin (VANCOCIN) 1-5 GM/200ML-% IVPB    Note to Pharmacy: Vevelyn Royals  : cabinet override      11/13/18 1059 11/13/18 2314   11/12/18 1700  vancomycin (VANCOCIN) 1,750 mg in sodium chloride 0.9 % 500 mL IVPB  Status:  Discontinued     1,750 mg 250 mL/hr over 120 Minutes Intravenous Every 24 hours 11/11/18 1738 11/12/18 0727   11/12/18 1700  vancomycin (VANCOCIN) 1,500 mg in sodium chloride 0.9 % 500 mL IVPB  Status:  Discontinued     1,500 mg 250 mL/hr over 120 Minutes Intravenous Every 24 hours 11/12/18 0727 11/12/18 0915   11/12/18 1400  cefTRIAXone (ROCEPHIN) 2 g in sodium chloride 0.9 % 100 mL IVPB     2 g 200 mL/hr over 30 Minutes Intravenous Every 24 hours 11/12/18 0915     11/11/18 1700  piperacillin-tazobactam (ZOSYN) IVPB 3.375 g  Status:  Discontinued     3.375 g 12.5 mL/hr over 240 Minutes Intravenous Every 8 hours 11/11/18 1552 11/12/18 0915   11/11/18 1700  vancomycin (VANCOCIN) 1,750 mg in sodium chloride 0.9 % 500 mL IVPB     1,750 mg 250 mL/hr over 120 Minutes Intravenous  Once 11/11/18 1611 11/11/18 1858   11/11/18 1630  cefTRIAXone (ROCEPHIN) 2 g in sodium chloride 0.9 % 100 mL IVPB  Status:  Discontinued     2 g 200 mL/hr over 30 Minutes Intravenous  Every 24 hours 11/11/18 1529 11/11/18 1552      Medications: Scheduled Meds: . Chlorhexidine Gluconate Cloth  6 each Topical Daily  . docusate sodium  100 mg Oral BID  . enoxaparin (LOVENOX) injection  1 mg/kg Subcutaneous Q12H  . feeding supplement (ENSURE ENLIVE)  237 mL Oral BID BM  . ferrous sulfate  325 mg Oral BID WC  . gabapentin  400 mg Oral TID  . insulin aspart  0-15 Units Subcutaneous TID WC  . insulin glargine  30 Units Subcutaneous QHS  . lisinopril  5 mg Oral Daily  . mupirocin ointment  1 application Nasal BID  . polyethylene glycol  17 g Oral BID  . senna-docusate  2 tablet Oral Daily  . sodium chloride flush  3 mL Intravenous Q12H  . traMADol  50 mg Oral Q6H  . vitamin B-12  1,000 mcg Oral Daily   Continuous Infusions: . sodium chloride    . sodium chloride 75 mL/hr at 11/13/18 0000  . sodium chloride 75 mL/hr at 11/13/18 1515  . cefTRIAXone (ROCEPHIN)  IV Stopped (11/12/18 1404)  . vancomycin 1,500 mg (11/13/18 2125)   PRN Meds:.sodium chloride, acetaminophen **OR** acetaminophen, alum &  mag hydroxide-simeth, baclofen, bisacodyl, diphenhydrAMINE, HYDROmorphone (DILAUDID) injection, magnesium citrate, menthol-cetylpyridinium **OR** phenol, metoCLOPramide **OR** metoCLOPramide (REGLAN) injection, ondansetron **OR** ondansetron (ZOFRAN) IV, oxyCODONE, sodium chloride flush, traZODone    Objective: Weight change:   Intake/Output Summary (Last 24 hours) at 11/14/2018 1001 Last data filed at 11/14/2018 0913 Gross per 24 hour  Intake 5028.98 ml  Output 1400 ml  Net 3628.98 ml   Blood pressure (!) 127/56, pulse 89, temperature 98.9 F (37.2 C), temperature source Oral, resp. rate 16, height 5\' 10"  (1.778 m), weight 83.9 kg, SpO2 95 %. Temp:  [97.6 F (36.4 C)-100 F (37.8 C)] 98.9 F (37.2 C) (11/06 0931) Pulse Rate:  [54-109] 89 (11/06 0932) Resp:  [12-20] 16 (11/06 0931) BP: (122-150)/(56-74) 127/56 (11/06 0931) SpO2:  [94 %-100 %] 95 % (11/06 0932)   Physical Exam: General: Alert and awake, oriented not in any acute distress. And more alert HEENT: anicteric sclera, EOMI CVS regular rate, normal  Chest: , no wheezing, no respiratory distress Abdomen: soft non-distended,  Extremities: bandage over left leg Skin: no rashes Neuro: nonfocal  CBC:    BMET Recent Labs    11/13/18 0538 11/14/18 0240  NA 134* 134*  K 4.4 4.3  CL 100 101  CO2 25 24  GLUCOSE 265* 234*  BUN 13 12  CREATININE 0.89 0.89  CALCIUM 8.4* 8.0*     Liver Panel  No results for input(s): PROT, ALBUMIN, AST, ALT, ALKPHOS, BILITOT, BILIDIR, IBILI in the last 72 hours.     Sedimentation Rate Recent Labs    11/14/18 0240  ESRSEDRATE 90*   C-Reactive Protein Recent Labs    11/11/18 1045 11/14/18 0240  CRP 35.3* 25.7*    Micro Results: Recent Results (from the past 720 hour(s))  SARS CORONAVIRUS 2 (TAT 6-24 HRS) Nasopharyngeal Nasopharyngeal Swab     Status: None   Collection Time: 11/10/18 10:32 PM   Specimen: Nasopharyngeal Swab  Result Value Ref Range Status   SARS Coronavirus 2 NEGATIVE NEGATIVE Final    Comment: (NOTE) SARS-CoV-2 target nucleic acids are NOT DETECTED. The SARS-CoV-2 RNA is generally detectable in upper and lower respiratory specimens during the acute phase of infection. Negative results do not preclude SARS-CoV-2 infection, do not rule out co-infections with other pathogens, and should not be used as the sole basis for treatment or other patient management decisions. Negative results must be combined with clinical observations, patient history, and epidemiological information. The expected result is Negative. Fact Sheet for Patients: SugarRoll.be Fact Sheet for Healthcare Providers: https://www.woods-mathews.com/ This test is not yet approved or cleared by the [place] FDA and  has been authorized for detection and/or diagnosis of SARS-CoV-2 by FDA under an Emergency  Use Authorization (EUA). This EUA will remain  in effect (meaning this test can be used) for the duration of the COVID-19 declaration under Section 56 4(b)(1) of the Act, 21 U.S.C. section 360bbb-3(b)(1), unless the authorization is terminated or revoked sooner. Performed at [facility], [place] [address], [place], [place] [address]   Culture, blood (routine x 2)     Status: None (Preliminary result)   Collection Time: 11/11/18  4:21 PM   Specimen: BLOOD LEFT HAND  Result Value Ref Range Status   Specimen Description   Final    BLOOD LEFT HAND Performed at [place] [address], [place], [place] [address]    Special Requests   Final    BOTTLES DRAWN AEROBIC AND ANAEROBIC Blood Culture adequate volume Performed  at [facility], [address] [place], [place] [address]    Culture   Final    NO GROWTH 2 DAYS Performed at [facility], [address] [place], [place] [address]    Report Status PENDING  Incomplete  Culture, blood (routine x 2)     Status: None (Preliminary result)   Collection Time: 11/11/18  4:25 PM   Specimen: BLOOD RIGHT HAND  Result Value Ref Range Status   Specimen Description   Final    BLOOD RIGHT HAND Performed at [facility], [address] [place], [place] [address]    Special Requests   Final    BOTTLES DRAWN AEROBIC AND ANAEROBIC Blood Culture adequate volume Performed at [facility], [address] [place], [place] [address]    Culture   Final    NO GROWTH 2 DAYS Performed at [facility], [address] [place], [place] [address]    Report Status PENDING  Incomplete  Surgical pcr screen     Status: Abnormal   Collection Time: 11/12/18 11:46 PM   Specimen: Nasal Mucosa; Nasal Swab  Result Value Ref Range Status   MRSA, PCR NEGATIVE NEGATIVE Final   Staphylococcus aureus POSITIVE (A) NEGATIVE Final    Comment: (NOTE) The Xpert SA Assay  (FDA approved for NASAL specimens in patients 79 years of age and older), is one component of a comprehensive surveillance program. It is not intended to diagnose infection nor to guide or monitor treatment. Performed at [facility], [address] [place], [place] [address]   Aerobic/Anaerobic Culture (surgical/deep wound)     Status: None (Preliminary result)   Collection Time: 11/13/18 11:07 AM   Specimen: Synovium  Result Value Ref Range Status   Specimen Description   Final    SYNOVIAL LEFT KNEE Performed at [facility], [address] [place], [place] [address]    Special Requests   Final    NONE Performed at [facility], [address] [place], [place] [address]    Gram Stain   Final    ABUNDANT WBC PRESENT, PREDOMINANTLY PMN FEW GRAM POSITIVE COCCI IN PAIRS Performed at [facility], [address] [place], [place] [address]    Culture PENDING  Incomplete   Report Status PENDING  Incomplete    Studies/Results: [place] Ekg Site Rite  Result Date: 11/13/2018 If Site Rite image not attached, placement could not be confirmed due to current cardiac rhythm.     Assessment/Plan:  INTERVAL HISTORY: [facility] seen on GS but no org yet   Active Problems:   Knee pain, left   Type 2 diabetes mellitus without complication, with long-term current use of insulin (HCC)   Cognitive impairment   ASVD (arteriosclerotic vascular disease)   Acute pain of left knee    Kimberly Miller is a 78 y.o. 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  1.  Prosthetic joint infection left side: Gram-positive cocci seen on Gram stain hopefully organism will grow though this possibility was compromised by antecedent antibiotic prior to aspirate from the joint.  Continue vancomycin and ceftriaxone for now  Follow-up culture data.  I will plan on treating her for 6 weeks as  mentioned above.   Kimberly Groutatsy Bohne has an appointment on December 15th, 2020 at 1045 AM.  The [facility] is located in the  [facility] at  [address] [place] in [place].  [address], which is located to the left of the elevators.  Phone: [phone]  Fax: [phone]  [email or web address]  I will followup her culture data closely this weekend and will be available for questions.    LOS: 4 days   Acey Lav 11/14/2018, 10:01 AM

## 2018-11-15 LAB — BASIC METABOLIC PANEL
Anion gap: 9 (ref 5–15)
BUN: 14 mg/dL (ref 8–23)
CO2: 25 mmol/L (ref 22–32)
Calcium: 8.4 mg/dL — ABNORMAL LOW (ref 8.9–10.3)
Chloride: 100 mmol/L (ref 98–111)
Creatinine, Ser: 0.64 mg/dL (ref 0.44–1.00)
GFR calc Af Amer: >60 mL/min (ref >60)
GFR calc non Af Amer: >60 mL/min (ref >60)
Glucose, Bld: 262 mg/dL — ABNORMAL HIGH (ref 70–99)
Potassium: 4.2 mmol/L (ref 3.5–5.1)
Sodium: 134 mmol/L — ABNORMAL LOW (ref 135–145)

## 2018-11-15 LAB — CBC
HCT: 29.6 % — ABNORMAL LOW (ref 36.0–46.0)
Hemoglobin: 9.3 g/dL — ABNORMAL LOW (ref 12.0–15.0)
MCH: 30 pg (ref 26.0–34.0)
MCHC: 31.4 g/dL (ref 30.0–36.0)
MCV: 95.5 fL (ref 80.0–100.0)
Platelets: 158 10*3/uL (ref 150–400)
RBC: 3.1 MIL/uL — ABNORMAL LOW (ref 3.87–5.11)
RDW: 14.4 % (ref 11.5–15.5)
WBC: 8.5 10*3/uL (ref 4.0–10.5)
nRBC: 0 % (ref 0.0–0.2)

## 2018-11-15 LAB — GLUCOSE, CAPILLARY
Glucose-Capillary: 221 mg/dL — ABNORMAL HIGH (ref 70–99)
Glucose-Capillary: 223 mg/dL — ABNORMAL HIGH (ref 70–99)
Glucose-Capillary: 237 mg/dL — ABNORMAL HIGH (ref 70–99)
Glucose-Capillary: 163 mg/dL — ABNORMAL HIGH (ref 70–99)
Glucose-Capillary: 214 mg/dL — ABNORMAL HIGH (ref 70–99)

## 2018-11-15 LAB — HCV COMMENT:

## 2018-11-15 LAB — HEPATITIS C ANTIBODY (REFLEX): HCV Ab: <0.1 s/co ratio (ref 0.0–0.9)

## 2018-11-15 MED ORDER — ENOXAPARIN SODIUM 40 MG/0.4ML ~~LOC~~ SOLN
40.0000 mg | Freq: Every day | SUBCUTANEOUS | Status: DC
  Administered 2018-11-16 – 2018-11-18 (×3): 40 mg via SUBCUTANEOUS
  Filled 2018-11-15 (×3): qty 0.4

## 2018-11-15 MED ORDER — SODIUM CHLORIDE 0.9% FLUSH: 10.0000 mL | INTRAVENOUS | Status: DC | PRN

## 2018-11-15 MED ORDER — CEFAZOLIN SODIUM-DEXTROSE 2-4 GM/100ML-% IV SOLN
2.0000 g | Freq: Three times a day (TID) | INTRAVENOUS | Status: DC
  Administered 2018-11-15 – 2018-11-18 (×11): 2 g via INTRAVENOUS
  Filled 2018-11-15 (×11): qty 100

## 2018-11-15 MED ORDER — INSULIN GLARGINE 100 UNIT/ML ~~LOC~~ SOLN
35.0000 [IU] | Freq: Every day | SUBCUTANEOUS | Status: DC
  Administered 2018-11-15: 35 [IU] via SUBCUTANEOUS
  Filled 2018-11-15 (×2): qty 0.35

## 2018-11-15 MED ORDER — INSULIN ASPART 100 UNIT/ML ~~LOC~~ SOLN
3.0000 [IU] | Freq: Three times a day (TID) | SUBCUTANEOUS | Status: DC
  Administered 2018-11-16: 13:00:00 3 [IU] via SUBCUTANEOUS

## 2018-11-15 NOTE — Progress Notes (Signed)
CSW received a call from pt's RN stating pt is likely to D/C today as pt is post-op day 2.  Per RN, pt is possibly appropriate for SNF placement as pt is confused, has knee issues and has an elderly husband who may not be able to assist pt with ADL's.  Pt having PICC line placed today.  CSW will continue to follow for D/C needs.  Alphonse Guild. Duvall Comes, LCSW, LCAS, CSI Transitions of Care Clinical Social Worker Care Coordination Department Ph: [phone]

## 2018-11-15 NOTE — Progress Notes (Signed)
Spoke with Rn re PICC placement.  Plan is for d/c home today.

## 2018-11-15 NOTE — Progress Notes (Signed)
Peripherally Inserted Central Catheter/Midline Placement  The IV Nurse has discussed with the patient and/or persons authorized to consent for the patient, the purpose of this procedure and the potential benefits and risks involved with this procedure.  The benefits include less needle sticks, lab draws from the catheter, and the patient may be discharged home with the catheter. Risks include, but not limited to, infection, bleeding, blood clot (thrombus formation), and puncture of an artery; nerve damage and irregular heartbeat and possibility to perform a PICC exchange if needed/ordered by physician.  Alternatives to this procedure were also discussed.  Bard Power PICC patient education guide, fact sheet on infection prevention and patient information card has been provided to patient /or left at bedside. Consent obtained from husband, due to patient's decreased level of consciousness.   PICC/Midline Placement Documentation  PICC Single Lumen 11/15/18 PICC Right Brachial 40 cm 0 cm (Active)  Indication for Insertion or Continuance of Line Home intravenous therapies (PICC only) 11/15/18 0900  Exposed Catheter (cm) 0 cm 11/15/18 0900  Site Assessment Clean;Dry;Intact 11/15/18 0900  Line Status Flushed;Blood return noted;Saline locked 11/15/18 0900  Dressing Type Transparent 11/15/18 0900  Dressing Status Clean;Dry;Intact;Antimicrobial disc in place 11/15/18 0900  Line Care Connections checked and tightened 11/15/18 0900  Line Adjustment (NICU/IV Team Only) No 11/15/18 0900  Dressing Intervention New dressing 11/15/18 0900  Dressing Change Due 11/22/18 11/15/18 0900       Lorenza Cambridge 11/15/2018, 9:54 AM

## 2018-11-15 NOTE — Progress Notes (Signed)
Subjective: 2 Days Post-Op Procedure(s) (LRB): EXCISIONAL TOTAL KNEE ARTHROPLASTY WITH ANTIBIOTIC SPACERS (Left) Patient reports pain as mild.    Objective: Vital signs in last 24 hours: Temp:  [97.5 F (36.4 C)-98.7 F (37.1 C)] 98 F (36.7 C) (11/07 0430) Pulse Rate:  [75-85] 75 (11/07 0430) Resp:  [16-18] 16 (11/07 0430) BP: (143-165)/(67-80) 157/80 (11/07 0430) SpO2:  [94 %-100 %] 96 % (11/07 0430)  Intake/Output from previous day: 11/06 0701 - 11/07 0700 In: 2834 [P.O.:1080; I.V.:754; IV Piggyback:1000] Out: 600 [Urine:600] Intake/Output this shift: No intake/output data recorded.  Recent Labs    11/13/18 0538 11/14/18 0240 11/15/18 0331  HGB 11.1* 9.8* 9.3*   Recent Labs    11/14/18 0240 11/15/18 0331  WBC 7.6 8.5  RBC 3.25* 3.10*  HCT 32.0* 29.6*  PLT 171 158   Recent Labs    11/14/18 0240 11/15/18 0331  NA 134* 134*  K 4.3 4.2  CL 101 100  CO2 24 25  BUN 12 14  CREATININE 0.89 0.64  GLUCOSE 234* 262*  CALCIUM 8.0* 8.4*   No results for input(s): LABPT, INR in the last 72 hours.  Neurologically intact ABD soft Neurovascular intact Sensation intact distally Intact pulses distally Dorsiflexion/Plantar flexion intact Incision: dressing C/D/I and no drainage No cellulitis present Compartment soft   Assessment/Plan: 2 Days Post-Op Procedure(s) (LRB): EXCISIONAL TOTAL KNEE ARTHROPLASTY WITH ANTIBIOTIC SPACERS (Left) Advance diet Up with therapy Continue abx    Cecilie Kicks 11/15/2018, 10:00 AM

## 2018-11-15 NOTE — Progress Notes (Signed)
CSW received a call from pt's provider stating that the pt's chosen facility [facility] in [place], [place]) is asking the [facility] here to call at ph: [phone] to seek details needed for admission.  CSW will continue to follow for D/C needs.  Alphonse Guild. Morgan Rennert, LCSW, LCAS, CSI Transitions of Care Clinical Social Worker Care Coordination Department Ph: [phone]

## 2018-11-15 NOTE — Progress Notes (Signed)
[place] for enoxaparin Indication: VTE treatment  Allergies  Allergen Reactions  . Daypro [Oxaprozin] Swelling and Rash    Face, mouth, tongue swelling  . Ibuprofen Swelling and Rash    Allergic to ALL anti-inflammatory medications, including excedrine, aleve  . Naproxen Sodium Swelling and Rash    Patient Measurements: Height: 5\' 10"  (177.8 cm) Weight: 185 lb (83.9 kg) IBW/kg (Calculated) : 68.5   Vital Signs: Temp: 98 F (36.7 C) (11/07 0430) BP: 157/80 (11/07 0430) Pulse Rate: 75 (11/07 0430)  Labs: Recent Labs    11/13/18 0538 11/14/18 0240 11/15/18 0331  HGB 11.1* 9.8* 9.3*  HCT 35.7* 32.0* 29.6*  PLT 198 171 158  CREATININE 0.89 0.89 0.64    Estimated Creatinine Clearance: 67.2 mL/min (by C-G formula based on SCr of 0.64 mg/dL).   Assessment: 78 yo female admitted with L septic knee.  Pharmacy consulted to dose enoxaparin for VTE treatment, no prior AC, last dose of ppx enoxaparin yesterday morning.  11/15/2018  Scr 0.64, CrCl ~  76mls/min H/H and plts WNL No bleeding reported No VTE noted> d/w Ortho PA- J. Bissell.  Will change from full dose LMWH to prophylactic dose LMWH  Goal of Therapy:  Anti-Xa level 0.6-1 units/ml 4hrs after LMWH dose given Monitor platelets by anticoagulation protocol   Plan:  DC Enoxaparin 1mg /kg (85mg ) SQ q12h Start LMWH 40 qday 11/8 am Pharmacy to sign off  Eudelia Bunch, Pharm.D [phone] 11/15/2018 11:31 AM

## 2018-11-15 NOTE — Progress Notes (Signed)
Physical Therapy Treatment Patient Details Name: Kimberly Miller MRN: [id number] DOB: Feb 10, 1940 Today's Date: 11/15/2018    History of Present Illness Patient is 78 y.o. female s/p Lt TKA resection with antibiotic spacer placment on 11/13/18 with PMH significant for stroke, CAD, DM, and OA.    PT Comments    Pt with severe L knee pain today, required max verbal and tactile cuing for pt encouragement to participate in OOB mobility. Pt required max +2 assist for sit to stands with use of stedy, and required very increased time. Pt tearful during transfer training, pt reporting due to pain, but PT feels there is also a significant anxiety component as well. Pt's husband in room this session, states pt is very difficult to encourage but "she just has to do it". PT to see pt for PM session for back to bed and exercises.   Follow Up Recommendations  SNF     Equipment Recommendations  None recommended by PT    Recommendations for Other Services       Precautions / Restrictions Precautions Precautions: Fall Restrictions Weight Bearing Restrictions: Yes LLE Weight Bearing: Partial weight bearing LLE Partial Weight Bearing Percentage or Pounds: 50%    Mobility  Bed Mobility Overal bed mobility: Needs Assistance Bed Mobility: Supine to Sit     Supine to sit: Max assist;HOB elevated     General bed mobility comments: Max assist for supine to sit for trunk elevation, LLE management, and scooting to EOB with use of bed pad. PT encouraged use of bedrails to have pt assist with pulling self to EOB, but pt using bedrails to guard against moving to EOB.  Transfers Overall transfer level: Needs assistance Equipment used: Ambulation equipment used Transfers: Sit to/from Stand Sit to Stand: Max assist;+2 physical assistance;+2 safety/equipment;From elevated surface         General transfer comment: Use of stedy for transfer training. Max assist +2 for power up, anterior trunk translation,  hip extension, and steadying upon standing. Prolonged time to let seat of stedy down because pt with such difficulty coming to full upright standing. Sit to stand x3 from stedy, with focus on anterior trunk translation, pt pulling self up with use of UEs,and hip extension to neutral upright standing. VERY increased time and max verbal/tactile motivation for pt participation.  Ambulation/Gait                 Stairs             Wheelchair Mobility    Modified Rankin (Stroke Patients Only)       Balance Overall balance assessment: Needs assistance Sitting-balance support: Bilateral upper extremity supported;Feet supported Sitting balance-Leahy Scale: Fair     Standing balance support: Bilateral upper extremity supported;During functional activity Standing balance-Leahy Scale: Zero Standing balance comment: completely reliant on support from PT and tech to maintain upright                            Cognition Arousal/Alertness: Awake/alert Behavior During Therapy: Anxious Overall Cognitive Status: Impaired/Different from baseline Area of Impairment: Attention;Memory;Following commands;Safety/judgement;Awareness;Problem solving                   Current Attention Level: Focused Memory: Decreased recall of precautions;Decreased short-term memory Following Commands: Follows one step commands inconsistently;Follows one step commands with increased time Safety/Judgement: Decreased awareness of safety Awareness: Emergent Problem Solving: Slow processing;Decreased initiation;Difficulty sequencing;Requires verbal cues;Requires tactile cues General Comments: pt very  difficult to motivate to participate in activity, max multimodal cuing to get pt to participate in PT.      Exercises Total Joint Exercises Ankle Circles/Pumps: Seated;10 reps;AROM;Both Quad Sets: AAROM;Left;5 reps;Seated    General Comments        Pertinent Vitals/Pain Pain Assessment:  Faces Faces Pain Scale: Hurts whole lot Pain Location: Lt knee Pain Descriptors / Indicators: Guarding;Sore;Crying;Moaning Pain Intervention(s): Limited activity within patient's tolerance;Monitored during session;Repositioned;Premedicated before session    Home Living                      Prior Function            PT Goals (current goals can now be found in the care plan section) Acute Rehab PT Goals Patient Stated Goal: to improve strength and walking PT Goal Formulation: With patient Time For Goal Achievement: 11/20/18 Potential to Achieve Goals: Fair Progress towards PT goals: Progressing toward goals    Frequency    7X/week      PT Plan Current plan remains appropriate    Co-evaluation              AM-PAC PT "6 Clicks" Mobility   Outcome Measure  Help needed turning from your back to your side while in a flat bed without using bedrails?: A Lot Help needed moving from lying on your back to sitting on the side of a flat bed without using bedrails?: Total Help needed moving to and from a bed to a chair (including a wheelchair)?: Total Help needed standing up from a chair using your arms (e.g., wheelchair or bedside chair)?: Total Help needed to walk in hospital room?: Total Help needed climbing 3-5 steps with a railing? : Total 6 Click Score: 7    End of Session Equipment Utilized During Treatment: Gait belt Activity Tolerance: Patient limited by pain Patient left: in chair;with call bell/phone within reach;with chair alarm set;with family/visitor present Nurse Communication: Mobility status;Need for lift equipment PT Visit Diagnosis: Muscle weakness (generalized) (M62.81);Difficulty in walking, not elsewhere classified (R26.2);Pain Pain - Right/Left: Left Pain - part of body: Knee     Time: 1135-1207 PT Time Calculation (min) (ACUTE ONLY): 32 min  Charges:  $Therapeutic Activity: 23-37 mins                     Kaylana Fenstermacher E, PT Acute  Rehabilitation Services Pager [phone]  Office [phone]    Nicci Vaughan D Elonda Husky 11/15/2018, 12:42 PM

## 2018-11-15 NOTE — Progress Notes (Signed)
Pharmacy Antibiotic Note  Kimberly Miller is a 78 y.o. female admitted on 11/10/2018 with L septic knee.  Pharmacy has been consulted for vancomycin dosing. Severe left knee pain with history of total knee replacement, infection versus gout.    11/15/2018  Infected L TKR> OR 11/5> explantation & cement interposition of L TKR.  WBC WNL, Scr 0.64. AF.  11/5 L knee surgical culture growing moderate staph aureus, sensitivities pending CTX changed to cefazolin   Plan: Cefazolin 2 gm IV q8h vancomycin dose 1500mg  IV q24h est AUC 517.8, Scr 0.94 F/u renal function, WBC, temp, culture data F/u SA sensitivities, if MRSA will check vancomycin levels around 10 pm dose Sunday night  Height: 5\' 10"  (177.8 cm) Weight: 185 lb (83.9 kg) IBW/kg (Calculated) : 68.5  Temp (24hrs), Avg:98.1 F (36.7 C), Min:97.5 F (36.4 C), Max:98.7 F (37.1 C)  Recent Labs  Lab 11/11/18 0156 11/11/18 1621 11/11/18 1912 11/12/18 0456 11/13/18 0538 11/14/18 0240 11/15/18 0331  WBC 21.2*  --   --   --  10.0 7.6 8.5  CREATININE 0.75  --   --  0.94 0.89 0.89 0.64  LATICACIDVEN  --  1.2 1.3  --   --   --   --     Estimated Creatinine Clearance: 67.2 mL/min (by C-G formula based on SCr of 0.64 mg/dL).    Allergies  Allergen Reactions  . Daypro [Oxaprozin] Swelling and Rash    Face, mouth, tongue swelling  . Ibuprofen Swelling and Rash    Allergic to ALL anti-inflammatory medications, including excedrine, aleve  . Naproxen Sodium Swelling and Rash   Antimicrobials this admission: 11/3 Vanc>> 11/4   11/5>>> 11/3 zosyn>> 11/4 11/4 CTX >> 11/7 11/5 Cefazolin x 1   11/7 >> Dose adjustments this admission:  Microbiology results: 11/3 BCx2: ngtd 11/3 L knee fluid(cx @ ortho office)>> 11/5 L knee synovial fluid (OR) >> GPC in pairs, moderate Staph Aureus 11/4 MRSA PCR neg 11/4 SA PCR + 11/5 HIV neg 11/6 Hep C neg  Thank you for allowing pharmacy to be a part of this patient's care.  Eudelia Bunch,  Pharm.D [phone] 11/15/2018 10:25 AM

## 2018-11-15 NOTE — Progress Notes (Signed)
Physical Therapy Treatment Patient Details Name: Kimberly Miller MRN: [id number] DOB: 1940-03-27 Today's Date: 11/15/2018    History of Present Illness Patient is 78 y.o. female s/p Lt TKA resection with antibiotic spacer placment on 11/13/18 with PMH significant for stroke, CAD, DM, and OA.    PT Comments    Pt continuing to complain of severe R knee pain, requires very increased time for all mobility, even assisting pt with lift, due to pain with any moving of RLE. Pt very difficult to motivate to participate in mobility, and requires at least AAROM, usually PROM, to participate in LE exercises. PT assisted pt in positioning RLE in neutral transverse and frontal plane alignment with the use of pillow and towel rolls laterally, as pt resorts to RLE external rotation, slight knee flexion as her position of comfort. PT to continue to follow acutely.    Follow Up Recommendations  SNF     Equipment Recommendations  None recommended by PT    Recommendations for Other Services       Precautions / Restrictions Precautions Precautions: Fall Restrictions Weight Bearing Restrictions: Yes LLE Weight Bearing: Partial weight bearing LLE Partial Weight Bearing Percentage or Pounds: 50%    Mobility  Bed Mobility Overal bed mobility: Needs Assistance         Sit to supine: Total assist;+2 for physical assistance;+2 for safety/equipment;HOB elevated   General bed mobility comments: pt up in chair upon PT arrival, required total assist +2 for return to supine and positioning in bed with bed pads. Use of maximove for back to bed. Very increased time for maximove use, because pt was guarding when PT/RN attempting to assist pt in scooting up in chair and repositioning lift pad.  Transfers Overall transfer level: (NT)                  Ambulation/Gait                 Stairs             Wheelchair Mobility    Modified Rankin (Stroke Patients Only)       Balance  Overall balance assessment: Needs assistance Sitting-balance support: Bilateral upper extremity supported;Feet supported Sitting balance-Leahy Scale: Fair                                      Cognition Arousal/Alertness: Awake/alert Behavior During Therapy: Anxious Overall Cognitive Status: Impaired/Different from baseline Area of Impairment: Attention;Memory;Following commands;Safety/judgement;Awareness;Problem solving                 Orientation Level: Disoriented to;Situation Current Attention Level: Focused Memory: Decreased recall of precautions;Decreased short-term memory Following Commands: Follows one step commands inconsistently;Follows one step commands with increased time Safety/Judgement: Decreased awareness of safety Awareness: Emergent Problem Solving: Slow processing;Decreased initiation;Difficulty sequencing;Requires verbal cues;Requires tactile cues General Comments: Pt had periods of not understanding PT this session, with looking far off in distance at times. Pt with continued anxiety about mobility.      Exercises Total Joint Exercises Ankle Circles/Pumps: AROM;Both;5 reps;Supine Quad Sets: AAROM;Left;5 reps;Supine Heel Slides: PROM;Right;15 reps;Supine(very limited ROM due to pt pain, ~10-25* knee flexion) Hip ABduction/ADduction: PROM;Right;5 reps;Supine    General Comments        Pertinent Vitals/Pain Pain Assessment: Faces Faces Pain Scale: Hurts whole lot Pain Location: Lt knee Pain Descriptors / Indicators: Guarding;Sore;Crying;Moaning Pain Intervention(s): Limited activity within patient's tolerance;Monitored during session;Premedicated before  session;Repositioned    Home Living                      Prior Function            PT Goals (current goals can now be found in the care plan section) Acute Rehab PT Goals Patient Stated Goal: to improve strength and walking PT Goal Formulation: With patient Time For Goal  Achievement: 11/20/18 Potential to Achieve Goals: Fair Progress towards PT goals: Progressing toward goals    Frequency    7X/week      PT Plan Current plan remains appropriate    Co-evaluation              AM-PAC PT "6 Clicks" Mobility   Outcome Measure  Help needed turning from your back to your side while in a flat bed without using bedrails?: A Lot Help needed moving from lying on your back to sitting on the side of a flat bed without using bedrails?: Total Help needed moving to and from a bed to a chair (including a wheelchair)?: Total Help needed standing up from a chair using your arms (e.g., wheelchair or bedside chair)?: Total Help needed to walk in hospital room?: Total Help needed climbing 3-5 steps with a railing? : Total 6 Click Score: 7    End of Session   Activity Tolerance: Patient limited by pain;Patient limited by fatigue Patient left: with call bell/phone within reach;with family/visitor present;in bed;with bed alarm set Nurse Communication: Mobility status;Need for lift equipment PT Visit Diagnosis: Muscle weakness (generalized) (M62.81);Difficulty in walking, not elsewhere classified (R26.2);Pain Pain - Right/Left: Left Pain - part of body: Knee     Time: 3358-2518 PT Time Calculation (min) (ACUTE ONLY): 40 min  Charges:  $Therapeutic Exercise: 8-22 mins $Therapeutic Activity: 8-22 mins                    Kimberly Miller E, PT Acute Rehabilitation Services Pager [phone]  Office [phone]   Kimberly Miller D Elonda Husky 11/15/2018, 5:27 PM

## 2018-11-15 NOTE — Progress Notes (Signed)
PROGRESS NOTE    Kimberly Miller  EYC:[id number] DOB: 1940/06/06 DOA: 11/10/2018 PCP: Josem Kaufmann, MD    Brief Narrative:  78 year old lady with prior history of left total knee replacement, diabetes mellitus, CVA, history of coronary artery disease was admitted to the hospital for left knee pain and swelling.  X-rays of the knee does not show any fracture, it was followed with a CT showing effusion.  She was seen by orthopedic surgeon and underwent aspiration of the knee on 11/3.  She was scheduled for removal of possible infected hardware.  Patient underwent removal of the infected hardware in total knee arthroplasty with antibiotic spacers on the left on 11/13/2018 by Dr. Alvan Dame.  Cultures from the knee aspirate grew staph species and patient is currently on vancomycin and Rocephin.  ID consulted by orthopedics and recommended to continue with vancomycin and Rocephin. Plan for 6 weeks of IV therapy as per ID. PT eval recommending SNF. CSW consulted. PICC line placed.   Assessment & Plan:   Active Problems:   Knee pain, left   Type 2 diabetes mellitus without complication, with long-term current use of insulin (HCC)   Cognitive impairment   ASVD (arteriosclerotic vascular disease)   Acute pain of left knee   Left knee pain/possible septic arthritis Orthopedics consulted and underwent removal of infected hardware and total knee arthroplasty by orthopedics on 11/13/18. Continue with IV antibiotics (IV vancomycin and IV ancef ). ID consulted and recommended at least 6 weeks of IV antibiotics.  PT evaluation recommending SNF. Culture from the knee aspirate showed MSSA . Will discuss with ID to see if we can d/c vancomycin and continue with ancef.  Discussed with the patient;s husband, who wants to be discharged to [facility].   Insulin-dependent diabetes mellitus  CBG (last 3)  Recent Labs    11/15/18 1021 11/15/18 1231 11/15/18 1653  GLUCAP 223* 237* 163*   Increase Lantus to 35  units and continue with sliding scale insulin, add 3 units of NovoLog 3 times daily AC for better control.     Anemia of blood loss from surgery superimposed on mild anemia of chronic disease  Baseline hemoglobin around 11, hemoglobin this morning is 9.3, probably slight blood loss from surgery. Continue to monitor and transfuse to keep hemoglobin greater than 7. Continue to monitor hemoglobin in am.    Hypertension:  Well controlled.    DVT prophylaxis: Lovenox Code Status: Full code Family Communication: None at bedside  disposition Plan: d/c when she has a bed at SNF, meanwhile we ill continue with PT, monitor hemoglobin and control CBG'S.   Consultants:   Orthopedics  ID  Procedures: Left total knee arthroplasty with antibiotic spacer placement Antimicrobials: Vancomycin and ANCEF.    Subjective: Painful ROM of the knee.   Objective: Vitals:   11/14/18 1817 11/14/18 2110 11/15/18 0430 11/15/18 1331  BP: (!) 165/69 (!) 160/71 (!) 157/80 (!) 151/68  Pulse: 75 76 75 75  Resp: 17 18 16 16   Temp: (!) 97.5 F (36.4 C) 98.7 F (37.1 C) 98 F (36.7 C) 98.4 F (36.9 C)  TempSrc: Oral     SpO2: 100% 99% 96% 97%  Weight:      Height:        Intake/Output Summary (Last 24 hours) at 11/15/2018 1702 Last data filed at 11/15/2018 1500 Gross per 24 hour  Intake 1982.98 ml  Output 350 ml  Net 1632.98 ml   Filed Weights   11/11/18 0126  Weight:  83.9 kg    Examination:  General exam: alert and comfortable.  Respiratory system: clear to auscultation, no wheezing.  Cardiovascular system: S1S2, RRR.  Gastrointestinal system: abd is soft, NT, ND BS+ Central nervous system: alert and able to answer all questions appropriately.  Extremities: Left knee bandaged and tender ROM of the left leg.  Skin:  No rashes.  Psychiatry: mood is appropriate.     Data Reviewed: I have personally reviewed following labs and imaging studies  CBC: Recent Labs  Lab 11/11/18 0156  11/13/18 0538 11/14/18 0240 11/15/18 0331  WBC 21.2* 10.0 7.6 8.5  NEUTROABS 17.3*  --   --   --   HGB 13.0 11.1* 9.8* 9.3*  HCT 40.0 35.7* 32.0* 29.6*  MCV 93.0 97.0 98.5 95.5  PLT 242 198 171 158   Basic Metabolic Panel: Recent Labs  Lab 11/11/18 0156 11/12/18 0456 11/13/18 0538 11/14/18 0240 11/15/18 0331  NA 128*  --  134* 134* 134*  K 3.7  --  4.4 4.3 4.2  CL 95*  --  100 101 100  CO2 23  --  25 24 25   GLUCOSE 277*  --  265* 234* 262*  BUN 15  --  13 12 14   CREATININE 0.75 0.94 0.89 0.89 0.64  CALCIUM 8.5*  --  8.4* 8.0* 8.4*   GFR: Estimated Creatinine Clearance: 67.2 mL/min (by C-G formula based on SCr of 0.64 mg/dL). Liver Function Tests: Recent Labs  Lab 11/11/18 0156  AST 44*  ALT 22  ALKPHOS 44  BILITOT 1.5*  PROT 6.3*  ALBUMIN 2.9*   No results for input(s): LIPASE, AMYLASE in the last 168 hours. No results for input(s): AMMONIA in the last 168 hours. Coagulation Profile: No results for input(s): INR, PROTIME in the last 168 hours. Cardiac Enzymes: No results for input(s): CKTOTAL, CKMB, CKMBINDEX, TROPONINI in the last 168 hours. BNP (last 3 results) No results for input(s): PROBNP in the last 8760 hours. HbA1C: No results for input(s): HGBA1C in the last 72 hours. CBG: Recent Labs  Lab 11/14/18 2107 11/15/18 0737 11/15/18 1021 11/15/18 1231 11/15/18 1653  GLUCAP 266* 221* 223* 237* 163*   Lipid Profile: No results for input(s): CHOL, HDL, LDLCALC, TRIG, CHOLHDL, LDLDIRECT in the last 72 hours. Thyroid Function Tests: No results for input(s): TSH, T4TOTAL, FREET4, T3FREE, THYROIDAB in the last 72 hours. Anemia Panel: No results for input(s): VITAMINB12, FOLATE, FERRITIN, TIBC, IRON, RETICCTPCT in the last 72 hours. Sepsis Labs: Recent Labs  Lab 11/11/18 1621 11/11/18 1912  LATICACIDVEN 1.2 1.3    Recent Results (from the past 240 hour(s))  SARS CORONAVIRUS 2 (TAT 6-24 HRS) Nasopharyngeal Nasopharyngeal Swab     Status: None    Collection Time: 11/10/18 10:32 PM   Specimen: Nasopharyngeal Swab  Result Value Ref Range Status   SARS Coronavirus 2 NEGATIVE NEGATIVE Final    Comment: (NOTE) SARS-CoV-2 target nucleic acids are NOT DETECTED. The SARS-CoV-2 RNA is generally detectable in upper and lower respiratory specimens during the acute phase of infection. Negative results do not preclude SARS-CoV-2 infection, do not rule out co-infections with other pathogens, and should not be used as the sole basis for treatment or other patient management decisions. Negative results must be combined with clinical observations, patient history, and epidemiological information. The expected result is Negative. Fact Sheet for Patients: 13/03/20 Fact Sheet for Healthcare Providers: 13/02/20 This test is not yet approved or cleared by the [place] FDA and  has been authorized for detection  and/or diagnosis of SARS-CoV-2 by FDA under an Emergency Use Authorization (EUA). This EUA will remain  in effect (meaning this test can be used) for the duration of the COVID-19 declaration under Section 56 4(b)(1) of the Act, 21 U.S.C. section 360bbb-3(b)(1), unless the authorization is terminated or revoked sooner. Performed at [facility], [address] [place], [place] [address]   Culture, blood (routine x 2)     Status: None (Preliminary result)   Collection Time: 11/11/18  4:21 PM   Specimen: BLOOD LEFT HAND  Result Value Ref Range Status   Specimen Description   Final    BLOOD LEFT HAND Performed at [facility], [address] [place], [place] [address]    Special Requests   Final    BOTTLES DRAWN AEROBIC AND ANAEROBIC Blood Culture adequate volume Performed at [facility], [address] [place], [place] [address]    Culture   Final    NO GROWTH 4 DAYS Performed at [facility], [address] [place], [place] [address]    Report Status PENDING  Incomplete  Culture, blood (routine x 2)     Status: None (Preliminary result)   Collection Time: 11/11/18  4:25 PM   Specimen: BLOOD RIGHT HAND  Result Value Ref Range Status   Specimen Description   Final    BLOOD RIGHT HAND Performed at [facility], [address] [place], [place] [address]    Special Requests   Final    BOTTLES DRAWN AEROBIC AND ANAEROBIC Blood Culture adequate volume Performed at [facility], [address] [place], [place] [address]    Culture   Final    NO GROWTH 4 DAYS Performed at [facility], [address] [place], [place] [address]    Report Status PENDING  Incomplete  Surgical pcr screen     Status: Abnormal   Collection Time: 11/12/18 11:46 PM   Specimen: Nasal Mucosa; Nasal Swab  Result Value Ref Range Status   MRSA, PCR NEGATIVE NEGATIVE Final   Staphylococcus aureus POSITIVE (A) NEGATIVE Final    Comment: (NOTE) The Xpert SA Assay (FDA approved for NASAL specimens in patients 79 years of age and older), is one component of a comprehensive surveillance program. It is not intended to diagnose infection nor to guide or monitor treatment. Performed at [facility], [address] [place], [place] [address]   Aerobic/Anaerobic Culture (surgical/deep wound)     Status: None (Preliminary result)   Collection Time: 11/13/18 11:07 AM   Specimen: Synovium  Result Value Ref Range Status   Specimen Description   Final    SYNOVIAL LEFT KNEE Performed at [facility], [address] [place], [place] [address]    Special Requests   Final    NONE Performed at [facility], [address] [place], [place] [address]    Gram Stain   Final    ABUNDANT WBC PRESENT, PREDOMINANTLY PMN FEW GRAM POSITIVE COCCI IN PAIRS Performed at [facility], [address] [place], [place] [address]     Culture   Final    MODERATE STAPHYLOCOCCUS AUREUS NO ANAEROBES ISOLATED; CULTURE IN PROGRESS FOR 5 DAYS    Report Status PENDING  Incomplete   Organism ID, Bacteria STAPHYLOCOCCUS AUREUS  Final      Susceptibility   Staphylococcus aureus - MIC*    CIPROFLOXACIN 4 RESISTANT Resistant  ERYTHROMYCIN <=0.25 SENSITIVE Sensitive     GENTAMICIN <=0.5 SENSITIVE Sensitive     OXACILLIN 0.5 SENSITIVE Sensitive     TETRACYCLINE <=1 SENSITIVE Sensitive     VANCOMYCIN <=0.5 SENSITIVE Sensitive     TRIMETH/SULFA <=10 SENSITIVE Sensitive     CLINDAMYCIN <=0.25 SENSITIVE Sensitive     RIFAMPIN <=0.5 SENSITIVE Sensitive     Inducible Clindamycin NEGATIVE Sensitive     * MODERATE STAPHYLOCOCCUS AUREUS         Radiology Studies: No results found.      Scheduled Meds:  Chlorhexidine Gluconate Cloth  6 each Topical Daily   docusate sodium  100 mg Oral BID   [START ON 11/16/2018] enoxaparin (LOVENOX) injection  40 mg Subcutaneous Daily   feeding supplement (ENSURE ENLIVE)  237 mL Oral BID BM   ferrous sulfate  325 mg Oral BID WC   gabapentin  400 mg Oral TID   insulin aspart  0-15 Units Subcutaneous TID WC   insulin aspart  2 Units Subcutaneous TID WC   insulin glargine  32 Units Subcutaneous QHS   lisinopril  5 mg Oral Daily   mupirocin ointment  1 application Nasal BID   polyethylene glycol  17 g Oral BID   senna-docusate  2 tablet Oral Daily   sodium chloride flush  3 mL Intravenous Q12H   traMADol  50 mg Oral Q6H   vitamin B-12  1,000 mcg Oral Daily   Continuous Infusions:  sodium chloride     sodium chloride 20 mL/hr at 11/15/18 0032    ceFAZolin (ANCEF) IV 2 g (11/15/18 1453)   vancomycin 1,500 mg (11/15/18 0032)     LOS: 5 days        Kathlen Mody, MD [facility] Pager [phone]  If 7PM-7AM, please contact night-coverage [email or web address] Password [email or web address] 11/15/2018, 5:02 PM

## 2018-11-16 LAB — CULTURE, BLOOD (ROUTINE X 2)
Culture: NO GROWTH
Culture: NO GROWTH
Special Requests: ADEQUATE
Special Requests: ADEQUATE

## 2018-11-16 LAB — GLUCOSE, CAPILLARY
Glucose-Capillary: 85 mg/dL (ref 70–99)
Glucose-Capillary: 99 mg/dL (ref 70–99)
Glucose-Capillary: 160 mg/dL — ABNORMAL HIGH (ref 70–99)
Glucose-Capillary: 138 mg/dL — ABNORMAL HIGH (ref 70–99)

## 2018-11-16 LAB — BASIC METABOLIC PANEL
Anion gap: 9 (ref 5–15)
BUN: 15 mg/dL (ref 8–23)
CO2: 27 mmol/L (ref 22–32)
Calcium: 8.4 mg/dL — ABNORMAL LOW (ref 8.9–10.3)
Chloride: 100 mmol/L (ref 98–111)
Creatinine, Ser: 0.65 mg/dL (ref 0.44–1.00)
GFR calc Af Amer: >60 mL/min (ref >60)
GFR calc non Af Amer: >60 mL/min (ref >60)
Glucose, Bld: 152 mg/dL — ABNORMAL HIGH (ref 70–99)
Potassium: 3.8 mmol/L (ref 3.5–5.1)
Sodium: 136 mmol/L (ref 135–145)

## 2018-11-16 LAB — CBC
HCT: 26.4 % — ABNORMAL LOW (ref 36.0–46.0)
Hemoglobin: 8.4 g/dL — ABNORMAL LOW (ref 12.0–15.0)
MCH: 30.1 pg (ref 26.0–34.0)
MCHC: 31.8 g/dL (ref 30.0–36.0)
MCV: 94.6 fL (ref 80.0–100.0)
Platelets: 217 10*3/uL (ref 150–400)
RBC: 2.79 MIL/uL — ABNORMAL LOW (ref 3.87–5.11)
RDW: 14.5 % (ref 11.5–15.5)
WBC: 8.6 10*3/uL (ref 4.0–10.5)
nRBC: 0 % (ref 0.0–0.2)

## 2018-11-16 LAB — RETICULOCYTES
Immature Retic Fract: 21.9 % — ABNORMAL HIGH (ref 2.3–15.9)
RBC.: 2.81 MIL/uL — ABNORMAL LOW (ref 3.87–5.11)
Retic Count, Absolute: 38.8 10*3/uL (ref 19.0–186.0)
Retic Ct Pct: 1.4 % (ref 0.4–3.1)

## 2018-11-16 LAB — FOLATE: Folate: 3.7 ng/mL — ABNORMAL LOW (ref >5.9)

## 2018-11-16 MED ORDER — INSULIN GLARGINE 100 UNIT/ML ~~LOC~~ SOLN
32.0000 [IU] | Freq: Every day | SUBCUTANEOUS | Status: DC
  Administered 2018-11-16: 32 [IU] via SUBCUTANEOUS
  Filled 2018-11-16 (×2): qty 0.32

## 2018-11-16 NOTE — Progress Notes (Signed)
Subjective: 3 Days Post-Op Procedure(s) (LRB): EXCISIONAL TOTAL KNEE ARTHROPLASTY WITH ANTIBIOTIC SPACERS (Left) Patient reports pain as mild.    Objective: Vital signs in last 24 hours: Temp:  [98 F (36.7 C)-98.5 F (36.9 C)] 98 F (36.7 C) (11/08 0543) Pulse Rate:  [74-79] 79 (11/08 0543) Resp:  [16-18] 16 (11/08 0543) BP: (139-154)/(68-95) 139/95 (11/08 0543) SpO2:  [97 %-99 %] 99 % (11/08 0543)  Intake/Output from previous day: 11/07 0701 - 11/08 0700 In: 1970.1 [P.O.:960; I.V.:160; IV Piggyback:750.1] Out: 2000 [Urine:2000] Intake/Output this shift: No intake/output data recorded.  Recent Labs    11/14/18 0240 11/15/18 0331 11/16/18 0319  HGB 9.8* 9.3* 8.4*   Recent Labs    11/15/18 0331 11/16/18 0319  WBC 8.5 8.6  RBC 3.10* 2.79*  HCT 29.6* 26.4*  PLT 158 217   Recent Labs    11/15/18 0331 11/16/18 0319  NA 134* 136  K 4.2 3.8  CL 100 100  CO2 25 27  BUN 14 15  CREATININE 0.64 0.65  GLUCOSE 262* 152*  CALCIUM 8.4* 8.4*   No results for input(s): LABPT, INR in the last 72 hours.  Neurologically intact ABD soft Neurovascular intact Sensation intact distally Intact pulses distally Dorsiflexion/Plantar flexion intact Incision: dressing C/D/I No cellulitis present Compartment soft   Assessment/Plan: 3 Days Post-Op Procedure(s) (LRB): EXCISIONAL TOTAL KNEE ARTHROPLASTY WITH ANTIBIOTIC SPACERS (Left) Advance diet Up with therapy Continue abx Plan per medicine and PT is to D/C to SNF with IV abx  Kimberly Miller 11/16/2018, 8:42 AM

## 2018-11-16 NOTE — Progress Notes (Signed)
Diagnosis: PJI  Culture Result: MSSA  Allergies  Allergen Reactions  . Daypro [Oxaprozin] Swelling and Rash    Face, mouth, tongue swelling  . Ibuprofen Swelling and Rash    Allergic to ALL anti-inflammatory medications, including excedrine, aleve  . Naproxen Sodium Swelling and Rash    OPAT Orders   Cefazolin 2 grams IV q 8 hours   Duration:  6 weeks  End Date:  12/24/2018  [facility] Care Per Protocol:    Labs  weekly while on IV antibiotics: _x_ CBC with differential _x_ BMP w GFR/CMP _x_ CRP x__ ESR    _x_ Please pull PIC at completion of IV antibiotics __ Please leave PIC in place until doctor has seen patient or been notified  Fax weekly labs to [phone]   Liah Morr has an appointment on 12/23/2018 with Dr. Tommy Medal at [place] for Infectious Disease is located in the [facility] at  [place] in [place].  [address], which is located to the left of the elevators.  Phone: [phone]  Fax: [phone]  [email or web address]  She should arrive 15 minutes BEFORE her appointment.

## 2018-11-16 NOTE — Progress Notes (Signed)
PHARMACY CONSULT NOTE FOR:  OUTPATIENT  PARENTERAL ANTIBIOTIC THERAPY (OPAT)  Indication: L infected TKR Regimen: Cefazolin 2gm iv q8hr End date: 12/24/2018  IV antibiotic discharge orders are pended. To discharging provider:  please sign these orders via discharge navigator,  Select New Orders & click on the button choice - Manage This Unsigned Work.     Thank you for allowing pharmacy to be a part of this patient's care.  Nani Skillern Crowford 11/16/2018, 6:14 AM

## 2018-11-16 NOTE — TOC Initial Note (Signed)
Transition of Care [facility]) - Initial/Assessment Note    Patient Details  Name: Kimberly Miller MRN: [id number] Date of Birth: 09-03-1940  Transition of Care [facility]) CM/SW Contact:    Kimberly Cutter, LCSW Phone Number: 11/16/2018, 12:58 PM  Clinical Narrative:   CSW received a call from pt's provider stating that the pt's chosen facility [facility] in [place], [place]) is asking the [place] Dept here to call at ph: [phone] to seek details needed for admission. LCSW completed call but was informed that Kimberly Miller with admissions is not back in the office until tomorrow. Facility as that [place] Dept call back tomorrow on 11/17/2018 to coordinate care and discharge as facility has already accepted patient per family. Patient's PASRR pending due to ongoing cognitive decline.  Expected Discharge Plan: Skilled Nursing Facility Barriers to Discharge: Ship broker, Transportation   Patient Goals and CMS Choice Patient states their goals for this hospitalization and ongoing recovery are:: To go to a SNF CMS Medicare.gov Compare Post Acute Care list provided to:: Patient Represenative (must comment) Choice offered to / list presented to : Spouse  Expected Discharge Plan and Services Expected Discharge Plan: [place] In-house Referral: Clinical Social Work   Post Acute Care Choice: Museum/gallery conservator, Home Health   Prior Living Arrangements/Services   Lives with:: Spouse Patient language and need for interpreter reviewed:: Yes Do you feel safe going back to the place where you live?: No   "She isn't strong enough."  Need for Family Participation in Patient Care: Yes (Comment) Care giver support system in place?: Yes (comment)   Criminal Activity/Legal Involvement Pertinent to Current Situation/Hospitalization: No - Comment as needed  Activities of Daily Living Home Assistive Devices/Equipment: CBG Meter, Cane (specify quad or straight) ADL Screening  (condition at time of admission) Patient's cognitive ability adequate to safely complete daily activities?: Yes Is the patient deaf or have difficulty hearing?: No Does the patient have difficulty seeing, even when wearing glasses/contacts?: No Does the patient have difficulty concentrating, remembering, or making decisions?: No Patient able to express need for assistance with ADLs?: Yes Does the patient have difficulty dressing or bathing?: Yes Independently performs ADLs?: Yes (appropriate for developmental age) Does the patient have difficulty walking or climbing stairs?: Yes Weakness of Legs: Both Weakness of Arms/Hands: None  Permission Sought/Granted Permission sought to share information with : Facility Sport and exercise psychologist, Tourist information centre manager, Family Supports Permission granted to share information with : Yes, Release of Information Signed  Share Information with NAME: Spouse  Permission granted to share info w AGENCY: [facility]     Permission granted to share info w Contact Information: Family  Emotional Assessment Appearance:: Appears stated age Attitude/Demeanor/Rapport: Gracious   Orientation: : Oriented to Self, Oriented to  Time Alcohol / Substance Use: Never Used Psych Involvement: No (comment)  Admission diagnosis:  KNEE PAIN Patient Active Problem List   Diagnosis Date Noted  . Knee pain, left 11/11/2018  . Type 2 diabetes mellitus without complication, with long-term current use of insulin ([place]) 11/11/2018  . Cognitive impairment 11/11/2018  . ASVD (arteriosclerotic vascular disease) 11/11/2018  . Acute pain of left knee 11/11/2018  . Primary localized osteoarthritis of left knee 11/01/2015  . S/P total knee arthroplasty 11/01/2015   PCP:  Kimberly Kaufmann, MD Pharmacy:   [facility] [place] [address] [place] [address] Phone: [phone] Fax: [phone]  Readmission Risk  Interventions No flowsheet data found.

## 2018-11-16 NOTE — Progress Notes (Signed)
PROGRESS NOTE    Kimberly Miller  WUJ:[id number] DOB: 09-30-1940 DOA: 11/10/2018 PCP: Josem Kaufmann, MD    Brief Narrative:  78 year old lady with prior history of left total knee replacement, diabetes mellitus, CVA, history of coronary artery disease was admitted to the hospital for left knee pain and swelling.  X-rays of the knee does not show any fracture, it was followed with a CT showing effusion.  She was seen by orthopedic surgeon and underwent aspiration of the knee on 11/3.  She was scheduled for removal of possible infected hardware.  Patient underwent removal of the infected hardware in total knee arthroplasty with antibiotic spacers on the left on 11/13/2018 by Dr. Alvan Dame.  Cultures from the knee aspirate grew staph species and patient is currently on vancomycin and Rocephin.  ID consulted by orthopedics and recommended to continue with vancomycin and Rocephin. Plan for 6 weeks of IV therapy as per ID. PT eval recommending SNF. CSW consulted. PICC line placed. OPAT orders placed.  Assessment & Plan:   Active Problems:   Knee pain, left   Type 2 diabetes mellitus without complication, with long-term current use of insulin ([facility])   Cognitive impairment   ASVD (arteriosclerotic vascular disease)   Acute pain of left knee   Left knee pain/possible septic arthritis Orthopedics consulted and underwent removal of infected hardware and total knee arthroplasty by orthopedics on 11/13/18. Culture from the knee aspirate showed MSSA .  She was initially started on IV vancomycin and Ancef later on transition to IV Ancef for 6 weeks of therapy as per ID recommendations.  PT evaluation recommending SNF.  Clinical social worker on board to assist with transition to rehab on discharge. Pain controlled with tramadol  Insulin-dependent diabetes mellitus  CBG (last 3)  Recent Labs    11/15/18 2221 11/16/18 0737 11/16/18 1200  GLUCAP 214* 85 99   Decrease to Lantus 32 units and d/c pre meal   Novolog. Continue with SSI.     Anemia of blood loss from surgery superimposed on mild anemia of chronic disease  Baseline hemoglobin around 11, hemoglobin this morning is around 8.4  probably blood loss from surgery. Continue to monitor and transfuse to keep hemoglobin greater than 7. Get anemia panel    Hypertension:  Well controlled.    Mild cognitive impairment Patient appears to be at baseline without any issues.  DVT prophylaxis: Lovenox Code Status: Full code Family Communication: None at bedside  disposition Plan: d/c when she has a bed at SNF, meanwhile we ill continue with PT, monitor hemoglobin and control CBG'S.   Consultants:   Orthopedics  ID  Procedures: Left total knee arthroplasty with antibiotic spacer placement Antimicrobials: IV Ancef for.  Of 6 weeks.    Subjective: Patient appears to be in good spirits today.  Denies any new complaints.  Objective: Vitals:   11/15/18 0430 11/15/18 1331 11/15/18 2224 11/16/18 0543  BP: (!) 157/80 (!) 151/68 (!) 154/73 (!) 139/95  Pulse: 75 75 74 79  Resp: 16 16 18 16   Temp: 98 F (36.7 C) 98.4 F (36.9 C) 98.5 F (36.9 C) 98 F (36.7 C)  TempSrc:   Oral Oral  SpO2: 96% 97% 99% 99%  Weight:      Height:        Intake/Output Summary (Last 24 hours) at 11/16/2018 1323 Last data filed at 11/16/2018 0900 Gross per 24 hour  Intake 1970.07 ml  Output 2000 ml  Net -29.93 ml   [employer]  11/11/18 0126  Weight: 83.9 kg    Examination:  General exam: Alert and comfortable, no distress noted Respiratory system: Clear to auscultation bilaterally, no wheezing or adventitious sounds heard Cardiovascular system: S1-S2 heard, regular rate rhythm, no JVD Gastrointestinal system: Abdomen is soft, nontender, not distended, bowel sounds are normal. Central nervous system: Alert and able to answer all questions appropriately Extremities: Painful range of movements of the left leg, trace edema Skin: No  rashes seen Psychiatry: Mood is appropriate    Data Reviewed: I have personally reviewed following labs and imaging studies  CBC: Recent Labs  Lab 11/11/18 0156 11/13/18 0538 11/14/18 0240 11/15/18 0331 11/16/18 0319  WBC 21.2* 10.0 7.6 8.5 8.6  NEUTROABS 17.3*  --   --   --   --   HGB 13.0 11.1* 9.8* 9.3* 8.4*  HCT 40.0 35.7* 32.0* 29.6* 26.4*  MCV 93.0 97.0 98.5 95.5 94.6  PLT 242 198 171 158 217   Basic Metabolic Panel: Recent Labs  Lab 11/11/18 0156 11/12/18 0456 11/13/18 0538 11/14/18 0240 11/15/18 0331 11/16/18 0319  NA 128*  --  134* 134* 134* 136  K 3.7  --  4.4 4.3 4.2 3.8  CL 95*  --  100 101 100 100  CO2 23  --  25 24 25 27   GLUCOSE 277*  --  265* 234* 262* 152*  BUN 15  --  13 12 14 15   CREATININE 0.75 0.94 0.89 0.89 0.64 0.65  CALCIUM 8.5*  --  8.4* 8.0* 8.4* 8.4*   GFR: Estimated Creatinine Clearance: 67.2 mL/min (by C-G formula based on SCr of 0.65 mg/dL). Liver Function Tests: Recent Labs  Lab 11/11/18 0156  AST 44*  ALT 22  ALKPHOS 44  BILITOT 1.5*  PROT 6.3*  ALBUMIN 2.9*   No results for input(s): LIPASE, AMYLASE in the last 168 hours. No results for input(s): AMMONIA in the last 168 hours. Coagulation Profile: No results for input(s): INR, PROTIME in the last 168 hours. Cardiac Enzymes: No results for input(s): CKTOTAL, CKMB, CKMBINDEX, TROPONINI in the last 168 hours. BNP (last 3 results) No results for input(s): PROBNP in the last 8760 hours. HbA1C: No results for input(s): HGBA1C in the last 72 hours. CBG: Recent Labs  Lab 11/15/18 1231 11/15/18 1653 11/15/18 2221 11/16/18 0737 11/16/18 1200  GLUCAP 237* 163* 214* 85 99   Lipid Profile: No results for input(s): CHOL, HDL, LDLCALC, TRIG, CHOLHDL, LDLDIRECT in the last 72 hours. Thyroid Function Tests: No results for input(s): TSH, T4TOTAL, FREET4, T3FREE, THYROIDAB in the last 72 hours. Anemia Panel: No results for input(s): VITAMINB12, FOLATE, FERRITIN, TIBC, IRON,  RETICCTPCT in the last 72 hours. Sepsis Labs: Recent Labs  Lab 11/11/18 1621 11/11/18 1912  LATICACIDVEN 1.2 1.3    Recent Results (from the past 240 hour(s))  SARS CORONAVIRUS 2 (TAT 6-24 HRS) Nasopharyngeal Nasopharyngeal Swab     Status: None   Collection Time: 11/10/18 10:32 PM   Specimen: Nasopharyngeal Swab  Result Value Ref Range Status   SARS Coronavirus 2 NEGATIVE NEGATIVE Final    Comment: (NOTE) SARS-CoV-2 target nucleic acids are NOT DETECTED. The SARS-CoV-2 RNA is generally detectable in upper and lower respiratory specimens during the acute phase of infection. Negative results do not preclude SARS-CoV-2 infection, do not rule out co-infections with other pathogens, and should not be used as the sole basis for treatment or other patient management decisions. Negative results must be combined with clinical observations, patient history, and epidemiological information. The expected  result is Negative. Fact Sheet for Patients: HairSlick.no Fact Sheet for Healthcare Providers: quierodirigir.com This test is not yet approved or cleared by the [place] FDA and  has been authorized for detection and/or diagnosis of SARS-CoV-2 by FDA under an Emergency Use Authorization (EUA). This EUA will remain  in effect (meaning this test can be used) for the duration of the COVID-19 declaration under Section 56 4(b)(1) of the Act, 21 U.S.C. section 360bbb-3(b)(1), unless the authorization is terminated or revoked sooner. Performed at [facility], [address], [place], [place] [address]   Culture, blood (routine x 2)     Status: None   Collection Time: 11/11/18  4:21 PM   Specimen: BLOOD LEFT HAND  Result Value Ref Range Status   Specimen Description   Final    BLOOD LEFT HAND Performed at [facility], [address] [place], [place] [address]    Special Requests   Final    BOTTLES  DRAWN AEROBIC AND ANAEROBIC Blood Culture adequate volume Performed at [facility], [address], [place], [place] [address]    Culture   Final    NO GROWTH 5 DAYS Performed at [facility], [address], [place], [place] [address]    Report Status 11/16/2018 FINAL  Final  Culture, blood (routine x 2)     Status: None   Collection Time: 11/11/18  4:25 PM   Specimen: BLOOD RIGHT HAND  Result Value Ref Range Status   Specimen Description   Final    BLOOD RIGHT HAND Performed at [facility], [address] [place], [place] [address]    Special Requests   Final    BOTTLES DRAWN AEROBIC AND ANAEROBIC Blood Culture adequate volume Performed at [facility], [address], [place], [place] [address]    Culture   Final    NO GROWTH 5 DAYS Performed at [facility], [address], [place], [place] [address]    Report Status 11/16/2018 FINAL  Final  Surgical pcr screen     Status: Abnormal   Collection Time: 11/12/18 11:46 PM   Specimen: Nasal Mucosa; Nasal Swab  Result Value Ref Range Status   MRSA, PCR NEGATIVE NEGATIVE Final   Staphylococcus aureus POSITIVE (A) NEGATIVE Final    Comment: (NOTE) The Xpert SA Assay (FDA approved for NASAL specimens in patients 79 years of age and older), is one component of a comprehensive surveillance program. It is not intended to diagnose infection nor to guide or monitor treatment. Performed at [facility], [address] [place], [place] [address]   Aerobic/Anaerobic Culture (surgical/deep wound)     Status: None (Preliminary result)   Collection Time: 11/13/18 11:07 AM   Specimen: Synovium  Result Value Ref Range Status   Specimen Description   Final    SYNOVIAL LEFT KNEE Performed at [facility], [address] [place], [place] [address]    Special Requests   Final    NONE Performed at [facility],  [address], [place], [place] [address]    Gram Stain   Final    ABUNDANT WBC PRESENT, PREDOMINANTLY PMN FEW GRAM POSITIVE COCCI IN PAIRS Performed at [facility], [address], [place], [place] [address]    Culture   Final    MODERATE STAPHYLOCOCCUS AUREUS NO ANAEROBES ISOLATED; CULTURE IN PROGRESS FOR 5 DAYS    Report Status  PENDING  Incomplete   Organism ID, Bacteria STAPHYLOCOCCUS AUREUS  Final      Susceptibility   Staphylococcus aureus - MIC*    CIPROFLOXACIN 4 RESISTANT Resistant     ERYTHROMYCIN <=0.25 SENSITIVE Sensitive     GENTAMICIN <=0.5 SENSITIVE Sensitive     OXACILLIN 0.5 SENSITIVE Sensitive     TETRACYCLINE <=1 SENSITIVE Sensitive     VANCOMYCIN <=0.5 SENSITIVE Sensitive     TRIMETH/SULFA <=10 SENSITIVE Sensitive     CLINDAMYCIN <=0.25 SENSITIVE Sensitive     RIFAMPIN <=0.5 SENSITIVE Sensitive     Inducible Clindamycin NEGATIVE Sensitive     * MODERATE STAPHYLOCOCCUS AUREUS         Radiology Studies: No results found.      Scheduled Meds: . Chlorhexidine Gluconate Cloth  6 each Topical Daily  . docusate sodium  100 mg Oral BID  . enoxaparin (LOVENOX) injection  40 mg Subcutaneous Daily  . feeding supplement (ENSURE ENLIVE)  237 mL Oral BID BM  . ferrous sulfate  325 mg Oral BID WC  . gabapentin  400 mg Oral TID  . insulin aspart  0-15 Units Subcutaneous TID WC  . insulin aspart  3 Units Subcutaneous TID WC  . insulin glargine  35 Units Subcutaneous QHS  . lisinopril  5 mg Oral Daily  . mupirocin ointment  1 application Nasal BID  . polyethylene glycol  17 g Oral BID  . senna-docusate  2 tablet Oral Daily  . sodium chloride flush  3 mL Intravenous Q12H  . traMADol  50 mg Oral Q6H  . vitamin B-12  1,000 mcg Oral Daily   Continuous Infusions: . sodium chloride    . sodium chloride 20 mL/hr at 11/15/18 0032  .  ceFAZolin (ANCEF) IV 2 g (11/16/18 0729)     LOS: 6 days        Kathlen ModyVijaya Marny Smethers, MD [facility] Pager  [phone]  If 7PM-7AM, please contact night-coverage [email or web address] Password [email or web address] 11/16/2018, 1:23 PM

## 2018-11-17 DIAGNOSIS — B9561 Methicillin susceptible Staphylococcus aureus infection as the cause of diseases classified elsewhere: Secondary | ICD-10-CM

## 2018-11-17 DIAGNOSIS — M00062 Staphylococcal arthritis, left knee: Secondary | ICD-10-CM

## 2018-11-17 LAB — BASIC METABOLIC PANEL
Anion gap: 7 (ref 5–15)
BUN: 13 mg/dL (ref 8–23)
CO2: 30 mmol/L (ref 22–32)
Calcium: 8.2 mg/dL — ABNORMAL LOW (ref 8.9–10.3)
Chloride: 97 mmol/L — ABNORMAL LOW (ref 98–111)
Creatinine, Ser: 0.61 mg/dL (ref 0.44–1.00)
GFR calc Af Amer: >60 mL/min (ref >60)
GFR calc non Af Amer: >60 mL/min (ref >60)
Glucose, Bld: 104 mg/dL — ABNORMAL HIGH (ref 70–99)
Potassium: 3.8 mmol/L (ref 3.5–5.1)
Sodium: 134 mmol/L — ABNORMAL LOW (ref 135–145)

## 2018-11-17 LAB — CBC
HCT: 29.5 % — ABNORMAL LOW (ref 36.0–46.0)
Hemoglobin: 9.3 g/dL — ABNORMAL LOW (ref 12.0–15.0)
MCH: 30 pg (ref 26.0–34.0)
MCHC: 31.5 g/dL (ref 30.0–36.0)
MCV: 95.2 fL (ref 80.0–100.0)
Platelets: 255 10*3/uL (ref 150–400)
RBC: 3.1 MIL/uL — ABNORMAL LOW (ref 3.87–5.11)
RDW: 14.5 % (ref 11.5–15.5)
WBC: 7.6 10*3/uL (ref 4.0–10.5)
nRBC: 0 % (ref 0.0–0.2)

## 2018-11-17 LAB — GLUCOSE, CAPILLARY
Glucose-Capillary: 78 mg/dL (ref 70–99)
Glucose-Capillary: 99 mg/dL (ref 70–99)
Glucose-Capillary: 193 mg/dL — ABNORMAL HIGH (ref 70–99)
Glucose-Capillary: 194 mg/dL — ABNORMAL HIGH (ref 70–99)

## 2018-11-17 LAB — IRON AND TIBC
Iron: 24 ug/dL — ABNORMAL LOW (ref 28–170)
Saturation Ratios: 12 % (ref 10.4–31.8)
TIBC: 203 ug/dL — ABNORMAL LOW (ref 250–450)
UIBC: 179 ug/dL

## 2018-11-17 LAB — FERRITIN: Ferritin: 222 ng/mL (ref 11–307)

## 2018-11-17 LAB — SARS CORONAVIRUS 2 (TAT 6-24 HRS): SARS Coronavirus 2: NEGATIVE

## 2018-11-17 LAB — VITAMIN B12: Vitamin B-12: 706 pg/mL (ref 180–914)

## 2018-11-17 MED ORDER — INSULIN ASPART 100 UNIT/ML ~~LOC~~ SOLN: 0.0000 [IU] | Freq: Three times a day (TID) | SUBCUTANEOUS | Status: DC

## 2018-11-17 MED ORDER — FOLIC ACID 1 MG PO TABS
1.0000 mg | ORAL_TABLET | Freq: Every day | ORAL | Status: DC
  Administered 2018-11-17 – 2018-11-18 (×2): 1 mg via ORAL
  Filled 2018-11-17 (×2): qty 1

## 2018-11-17 MED ORDER — INSULIN GLARGINE 100 UNIT/ML ~~LOC~~ SOLN
30.0000 [IU] | Freq: Every day | SUBCUTANEOUS | Status: DC
  Administered 2018-11-17: 30 [IU] via SUBCUTANEOUS
  Filled 2018-11-17 (×2): qty 0.3

## 2018-11-17 MED ORDER — LIP MEDEX EX OINT
TOPICAL_OINTMENT | CUTANEOUS | Status: AC
  Filled 2018-11-17: qty 7

## 2018-11-17 NOTE — Progress Notes (Signed)
Subjective: No new complaints   Antibiotics:  Anti-infectives (From admission, onward)   Start     Dose/Rate Route Frequency Ordered Stop   11/15/18 0900  ceFAZolin (ANCEF) IVPB 2g/100 mL premix     2 g 200 mL/hr over 30 Minutes Intravenous Every 8 hours 11/15/18 0856     11/13/18 2200  vancomycin (VANCOCIN) 1,500 mg in sodium chloride 0.9 % 500 mL IVPB  Status:  Discontinued     1,500 mg 250 mL/hr over 120 Minutes Intravenous Every 24 hours 11/13/18 1608 11/16/18 0013   11/13/18 1145  tobramycin (NEBCIN) powder  Status:  Discontinued       As needed 11/13/18 1155 11/13/18 1412   11/13/18 1145  vancomycin (VANCOCIN) powder  Status:  Discontinued       As needed 11/13/18 1155 11/13/18 1412   11/13/18 1059  ceFAZolin (ANCEF) 2-4 GM/100ML-% IVPB    Note to Pharmacy: Vevelyn Royals  : cabinet override      11/13/18 1059 11/13/18 2314   11/13/18 1059  vancomycin (VANCOCIN) 1-5 GM/200ML-% IVPB    Note to Pharmacy: Vevelyn Royals  : cabinet override      11/13/18 1059 11/13/18 2314   11/12/18 1700  vancomycin (VANCOCIN) 1,750 mg in sodium chloride 0.9 % 500 mL IVPB  Status:  Discontinued     1,750 mg 250 mL/hr over 120 Minutes Intravenous Every 24 hours 11/11/18 1738 11/12/18 0727   11/12/18 1700  vancomycin (VANCOCIN) 1,500 mg in sodium chloride 0.9 % 500 mL IVPB  Status:  Discontinued     1,500 mg 250 mL/hr over 120 Minutes Intravenous Every 24 hours 11/12/18 0727 11/12/18 0915   11/12/18 1400  cefTRIAXone (ROCEPHIN) 2 g in sodium chloride 0.9 % 100 mL IVPB  Status:  Discontinued     2 g 200 mL/hr over 30 Minutes Intravenous Every 24 hours 11/12/18 0915 11/15/18 0845   11/11/18 1700  piperacillin-tazobactam (ZOSYN) IVPB 3.375 g  Status:  Discontinued     3.375 g 12.5 mL/hr over 240 Minutes Intravenous Every 8 hours 11/11/18 1552 11/12/18 0915   11/11/18 1700  vancomycin (VANCOCIN) 1,750 mg in sodium chloride 0.9 % 500 mL IVPB     1,750 mg 250 mL/hr over 120  Minutes Intravenous  Once 11/11/18 1611 11/11/18 1858   11/11/18 1630  cefTRIAXone (ROCEPHIN) 2 g in sodium chloride 0.9 % 100 mL IVPB  Status:  Discontinued     2 g 200 mL/hr over 30 Minutes Intravenous Every 24 hours 11/11/18 1529 11/11/18 1552      Medications: Scheduled Meds: . Chlorhexidine Gluconate Cloth  6 each Topical Daily  . docusate sodium  100 mg Oral BID  . enoxaparin (LOVENOX) injection  40 mg Subcutaneous Daily  . feeding supplement (ENSURE ENLIVE)  237 mL Oral BID BM  . ferrous sulfate  325 mg Oral BID WC  . gabapentin  400 mg Oral TID  . insulin aspart  0-15 Units Subcutaneous TID WC  . insulin glargine  32 Units Subcutaneous QHS  . lisinopril  5 mg Oral Daily  . mupirocin ointment  1 application Nasal BID  . polyethylene glycol  17 g Oral BID  . senna-docusate  2 tablet Oral Daily  . sodium chloride flush  3 mL Intravenous Q12H  . traMADol  50 mg Oral Q6H  . vitamin B-12  1,000 mcg Oral Daily   Continuous Infusions: . sodium chloride    . sodium chloride 20 mL/hr  at 11/15/18 0032  .  ceFAZolin (ANCEF) IV 2 g (11/17/18 0616)   PRN Meds:.sodium chloride, acetaminophen **OR** acetaminophen, alum & mag hydroxide-simeth, baclofen, bisacodyl, diphenhydrAMINE, HYDROmorphone (DILAUDID) injection, magnesium citrate, menthol-cetylpyridinium **OR** phenol, metoCLOPramide **OR** metoCLOPramide (REGLAN) injection, ondansetron **OR** ondansetron (ZOFRAN) IV, oxyCODONE, sodium chloride flush, sodium chloride flush, traZODone    Objective: Weight change:   Intake/Output Summary (Last 24 hours) at 11/17/2018 1302 Last data filed at 11/17/2018 1145 Gross per 24 hour  Intake 1469.93 ml  Output 2150 ml  Net -680.07 ml   Blood pressure 138/63, pulse 84, temperature 99 F (37.2 C), temperature source Oral, resp. rate 16, height 5\' 10"  (1.778 m), weight 83.9 kg, SpO2 97 %. Temp:  [98.9 F (37.2 C)-99 F (37.2 C)] 99 F (37.2 C) (11/09 0952) Pulse Rate:  [84-96] 84 (11/09  0952) Resp:  [14-20] 16 (11/09 0952) BP: (124-146)/(63-110) 138/63 (11/09 0952) SpO2:  [91 %-98 %] 97 % (11/09 16100952)  Physical Exam: General: Alert and awake, oriented  Conversant  distress. And more alert HEENT: anicteric sclera, EOMI CVS regular rate, normal  Chest: , no wheezing, no respiratory distress Abdomen: soft non-distended,  Extremities: bandage over left leg Skin: no rashes Neuro: nonfocal  CBC:    BMET Recent Labs    11/16/18 0319 11/17/18 0412  NA 136 134*  K 3.8 3.8  CL 100 97*  CO2 27 30  GLUCOSE 152* 104*  BUN 15 13  CREATININE 0.65 0.61  CALCIUM 8.4* 8.2*     Liver Panel  No results for input(s): PROT, ALBUMIN, AST, ALT, ALKPHOS, BILITOT, BILIDIR, IBILI in the last 72 hours.     Sedimentation Rate No results for input(s): ESRSEDRATE in the last 72 hours. C-Reactive Protein No results for input(s): CRP in the last 72 hours.  Micro Results: Recent Results (from the past 720 hour(s))  SARS CORONAVIRUS 2 (TAT 6-24 HRS) Nasopharyngeal Nasopharyngeal Swab     Status: None   Collection Time: 11/10/18 10:32 PM   Specimen: Nasopharyngeal Swab  Result Value Ref Range Status   SARS Coronavirus 2 NEGATIVE NEGATIVE Final    Comment: (NOTE) SARS-CoV-2 target nucleic acids are NOT DETECTED. The SARS-CoV-2 RNA is generally detectable in upper and lower respiratory specimens during the acute phase of infection. Negative results do not preclude SARS-CoV-2 infection, do not rule out co-infections with other pathogens, and should not be used as the sole basis for treatment or other patient management decisions. Negative results must be combined with clinical observations, patient history, and epidemiological information. The expected result is Negative. Fact Sheet for Patients: HairSlick.nohttps://www.fda.gov/media/138098/download Fact Sheet for Healthcare Providers: quierodirigir.comhttps://www.fda.gov/media/138095/download This test is not yet approved or cleared by the [place] FDA and  has been authorized for detection and/or diagnosis of SARS-CoV-2 by FDA under an Emergency Use Authorization (EUA). This EUA will remain  in effect (meaning this test can be used) for the duration of the COVID-19 declaration under Section 56 4(b)(1) of the Act, 21 U.S.C. section 360bbb-3(b)(1), unless the authorization is terminated or revoked sooner. Performed at [facility], [address] [place], [place] [address]   Culture, blood (routine x 2)     Status: None   Collection Time: 11/11/18  4:21 PM   Specimen: BLOOD LEFT HAND  Result Value Ref Range Status   Specimen Description   Final    BLOOD LEFT HAND Performed at [facility], [address] [place], [place] [address]    Special Requests  Final    BOTTLES DRAWN AEROBIC AND ANAEROBIC Blood Culture adequate volume Performed at [facility], [address], [place], [place] [address]    Culture   Final    NO GROWTH 5 DAYS Performed at [facility], [address], [place], [place] [address]    Report Status 11/16/2018 FINAL  Final  Culture, blood (routine x 2)     Status: None   Collection Time: 11/11/18  4:25 PM   Specimen: BLOOD RIGHT HAND  Result Value Ref Range Status   Specimen Description   Final    BLOOD RIGHT HAND Performed at [facility], [address], [place], [place] [address]    Special Requests   Final    BOTTLES DRAWN AEROBIC AND ANAEROBIC Blood Culture adequate volume Performed at [facility], [address], [place], [place] [address]    Culture   Final    NO GROWTH 5 DAYS Performed at [facility], [address] [place], [place] [address]    Report Status 11/16/2018 FINAL  Final  Surgical pcr screen     Status: Abnormal   Collection Time: 11/12/18 11:46 PM   Specimen: Nasal Mucosa; Nasal Swab  Result Value Ref Range Status   MRSA, PCR NEGATIVE NEGATIVE Final   Staphylococcus  aureus POSITIVE (A) NEGATIVE Final    Comment: (NOTE) The Xpert SA Assay (FDA approved for NASAL specimens in patients 83 years of age and older), is one component of a comprehensive surveillance program. It is not intended to diagnose infection nor to guide or monitor treatment. Performed at [facility], [address], [place], [place] [address]   Aerobic/Anaerobic Culture (surgical/deep wound)     Status: None (Preliminary result)   Collection Time: 11/13/18 11:07 AM   Specimen: Synovium  Result Value Ref Range Status   Specimen Description   Final    SYNOVIAL LEFT KNEE Performed at [facility], [address], [place], [place] [address]    Special Requests   Final    NONE Performed at [facility], [address] [place], [place] [address]    Gram Stain   Final    ABUNDANT WBC PRESENT, PREDOMINANTLY PMN FEW GRAM POSITIVE COCCI IN PAIRS Performed at [facility], [address] [place], [place] [address]    Culture   Final    MODERATE STAPHYLOCOCCUS AUREUS NO ANAEROBES ISOLATED; CULTURE IN PROGRESS FOR 5 DAYS    Report Status PENDING  Incomplete   Organism ID, Bacteria STAPHYLOCOCCUS AUREUS  Final      Susceptibility   Staphylococcus aureus - MIC*    CIPROFLOXACIN 4 RESISTANT Resistant     ERYTHROMYCIN <=0.25 SENSITIVE Sensitive     GENTAMICIN <=0.5 SENSITIVE Sensitive     OXACILLIN 0.5 SENSITIVE Sensitive     TETRACYCLINE <=1 SENSITIVE Sensitive     VANCOMYCIN <=0.5 SENSITIVE Sensitive     TRIMETH/SULFA <=10 SENSITIVE Sensitive     CLINDAMYCIN <=0.25 SENSITIVE Sensitive     RIFAMPIN <=0.5 SENSITIVE Sensitive     Inducible Clindamycin NEGATIVE Sensitive     * MODERATE STAPHYLOCOCCUS AUREUS    Studies/Results: No results found.    Assessment/Plan:  INTERVAL HISTORY:   MSSA isolated from culture, narrowed to ancef  Active Problems:   Knee pain, left   Type 2 diabetes mellitus without  complication, with long-term current use of insulin (HCC)   Cognitive impairment   ASVD (  arteriosclerotic vascular disease)   Acute pain of left knee    Kimberly Miller is a 78 y.o. 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  1.  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  I will give her 6 weeks of postoperative antibiotics with ancef  I explained nature of this type of infection to her and her husband and let them know she would be following up w me in clinic.   Ki Luckman has an appointment on December 15th, 2020 at [facility] is located in the [facility] at  [address] in [place].  [address], which is located to the left of the elevators.  Phone: [phone]  Fax: [phone]  [email or web address]   I will sign off for now  Please call with further questions.   LOS: 7 days   Alcide Evener 11/17/2018, 1:02 PM

## 2018-11-17 NOTE — Care Management Important Message (Signed)
Important Message  Patient Details IM Letter given to Kathrin Greathouse SW to present to the Patient Name: Kimberly Miller MRN: [id number] Date of Birth: 10/24/1940   Medicare Important Message Given:  Yes     Kerin Salen 11/17/2018, 1:32 PM

## 2018-11-17 NOTE — Progress Notes (Signed)
PROGRESS NOTE    Kimberly Miller  BVQ:[id number] DOB: [birth year] DOA: 11/10/2018 PCP: Aniceto Boss, MD    Brief Narrative:  78 year old lady with prior history of left total knee replacement, diabetes mellitus, CVA, history of coronary artery disease was admitted to the hospital for left knee pain and swelling.  X-rays of the knee does not show any fracture, it was followed with a CT showing effusion.  She was seen by orthopedic surgeon and underwent aspiration of the knee on 11/3.  She was scheduled for removal of possible infected hardware.  Patient underwent removal of the infected hardware in total knee arthroplasty with antibiotic spacers on the left on 11/13/2018 by Dr. Charlann Boxer.  Cultures from the knee aspirate grew staph species and patient is currently on vancomycin and Rocephin.  ID consulted by orthopedics and recommended to continue with vancomycin and Rocephin. Plan for 6 weeks of IV therapy as per ID. PT eval recommending SNF. CSW consulted. PICC line placed. OPAT orders placed.    Assessment & Plan:   Active Problems:   Knee pain, left   Type 2 diabetes mellitus without complication, with long-term current use of insulin (HCC)   Cognitive impairment   ASVD (arteriosclerotic vascular disease)   Acute pain of left knee   Left knee pain/possible septic arthritis Orthopedics consulted and underwent removal of infected hardware and total knee arthroplasty by orthopedics on 11/13/18. Culture from the knee aspirate showed MSSA .  She was initially started on IV vancomycin and Ancef later on transition to IV Ancef for 6 weeks of therapy as per ID recommendations.  PT evaluation recommending SNF.  Clinical social worker on board to assist with transition to rehab on discharge. Pain controlled with tramadol.  Patient reports her pain is controlled with tramadol but continues to have pain on range of movements of the knee.  Insulin-dependent diabetes mellitus  CBG (last 3)  Recent Labs   11/16/18 2145 11/17/18 0719 11/17/18 1117  GLUCAP 138* 78 99   Decrease the Lantus to 30 units and continue with sliding scale insulin.    Anemia of blood loss from surgery superimposed on mild anemia of chronic disease  Baseline hemoglobin around 11, hemoglobin dropped from 11-9.3 probably secondary to blood loss from surgery.  Anemia panel showed low folate levels and low iron levels supplementation will be added.     Hypertension:  Well controlled.    Mild cognitive impairment Patient appears to be at baseline without any issues.  Constipation Add senna Colace and MiraLAX to her regimen.  DVT prophylaxis: Lovenox Code Status: Full code Family Communication: None at bedside discussed with husband at bedside.  disposition Plan: d/c when she has a bed at SNF, meanwhile we ill continue with PT, monitor hemoglobin and control CBG'S.   Consultants:   Orthopedics  ID  Procedures: Left total knee arthroplasty with antibiotic spacer placement Antimicrobials: IV Ancef for.  Of 6 weeks.    Subjective: Pain controlled with tramadol.  No chest pain or shortness of breath, no nausea vomiting or diarrhea.  Objective: Vitals:   11/16/18 1349 11/16/18 2148 11/17/18 0603 11/17/18 0952  BP: (!) 141/64 (!) 146/83 (!) 124/110 138/63  Pulse: 91 96 94 84  Resp: 16 20 14 16   Temp:  98.9 F (37.2 C) 98.9 F (37.2 C) 99 F (37.2 C)  TempSrc:  Oral Oral Oral  SpO2: 96% 98% 91% 97%  Weight:      Height:        Intake/Output  Summary (Last 24 hours) at 11/17/2018 1409 Last data filed at 11/17/2018 1145 Gross per 24 hour  Intake 1229.93 ml  Output 2150 ml  Net -920.07 ml   Filed Weights   11/11/18 0126  Weight: 83.9 kg    Examination:  General exam: Alert and comfortable, no distress noted. Respiratory system: Clear to auscultation bilaterally no wheezing or rhonchi Cardiovascular system: S1-S2 heard, regular rate rhythm, no JVD Gastrointestinal system: Abdomen is  soft, nontender, nondistended, bowel sounds are normal Central nervous system: Alert and able to answer all questions appropriately Extremities: Painful range of movements of the left leg, bilateral trace edema Skin: No rashes seen Psychiatry: Mood is appropriate    Data Reviewed: I have personally reviewed following labs and imaging studies  CBC: Recent Labs  Lab 11/11/18 0156 11/13/18 0538 11/14/18 0240 11/15/18 0331 11/16/18 0319 11/17/18 0412  WBC 21.2* 10.0 7.6 8.5 8.6 7.6  NEUTROABS 17.3*  --   --   --   --   --   HGB 13.0 11.1* 9.8* 9.3* 8.4* 9.3*  HCT 40.0 35.7* 32.0* 29.6* 26.4* 29.5*  MCV 93.0 97.0 98.5 95.5 94.6 95.2  PLT 242 198 171 158 217 202   Basic Metabolic Panel: Recent Labs  Lab 11/13/18 0538 11/14/18 0240 11/15/18 0331 11/16/18 0319 11/17/18 0412  NA 134* 134* 134* 136 134*  K 4.4 4.3 4.2 3.8 3.8  CL 100 101 100 100 97*  CO2 25 24 25 27 30   GLUCOSE 265* 234* 262* 152* 104*  BUN 13 12 14 15 13   CREATININE 0.89 0.89 0.64 0.65 0.61  CALCIUM 8.4* 8.0* 8.4* 8.4* 8.2*   GFR: Estimated Creatinine Clearance: 67.2 mL/min (by C-G formula based on SCr of 0.61 mg/dL). Liver Function Tests: Recent Labs  Lab 11/11/18 0156  AST 44*  ALT 22  ALKPHOS 44  BILITOT 1.5*  PROT 6.3*  ALBUMIN 2.9*   No results for input(s): LIPASE, AMYLASE in the last 168 hours. No results for input(s): AMMONIA in the last 168 hours. Coagulation Profile: No results for input(s): INR, PROTIME in the last 168 hours. Cardiac Enzymes: No results for input(s): CKTOTAL, CKMB, CKMBINDEX, TROPONINI in the last 168 hours. BNP (last 3 results) No results for input(s): PROBNP in the last 8760 hours. HbA1C: No results for input(s): HGBA1C in the last 72 hours. CBG: Recent Labs  Lab 11/16/18 1200 11/16/18 1701 11/16/18 2145 11/17/18 0719 11/17/18 1117  GLUCAP 99 160* 138* 78 99   Lipid Profile: No results for input(s): CHOL, HDL, LDLCALC, TRIG, CHOLHDL, LDLDIRECT in the  last 72 hours. Thyroid Function Tests: No results for input(s): TSH, T4TOTAL, FREET4, T3FREE, THYROIDAB in the last 72 hours. Anemia Panel: Recent Labs    11/16/18 0319 11/16/18 1430  VITAMINB12  --  706  FOLATE  --  3.7*  FERRITIN  --  222  TIBC  --  203*  IRON  --  24*  RETICCTPCT 1.4  --    Sepsis Labs: Recent Labs  Lab 11/11/18 1621 11/11/18 1912  LATICACIDVEN 1.2 1.3    Recent Results (from the past 240 hour(s))  SARS CORONAVIRUS 2 (TAT 6-24 HRS) Nasopharyngeal Nasopharyngeal Swab     Status: None   Collection Time: 11/10/18 10:32 PM   Specimen: Nasopharyngeal Swab  Result Value Ref Range Status   SARS Coronavirus 2 NEGATIVE NEGATIVE Final    Comment: (NOTE) SARS-CoV-2 target nucleic acids are NOT DETECTED. The SARS-CoV-2 RNA is generally detectable in upper and lower respiratory specimens during  the acute phase of infection. Negative results do not preclude SARS-CoV-2 infection, do not rule out co-infections with other pathogens, and should not be used as the sole basis for treatment or other patient management decisions. Negative results must be combined with clinical observations, patient history, and epidemiological information. The expected result is Negative. Fact Sheet for Patients: HairSlick.no Fact Sheet for Healthcare Providers: quierodirigir.com This test is not yet approved or cleared by the [place] FDA and  has been authorized for detection and/or diagnosis of SARS-CoV-2 by FDA under an Emergency Use Authorization (EUA). This EUA will remain  in effect (meaning this test can be used) for the duration of the COVID-19 declaration under Section 56 4(b)(1) of the Act, 21 U.S.C. section 360bbb-3(b)(1), unless the authorization is terminated or revoked sooner. Performed at [facility], [address] [place], [place] [address]   Culture, blood (routine x 2)     Status: None    Collection Time: 11/11/18  4:21 PM   Specimen: BLOOD LEFT HAND  Result Value Ref Range Status   Specimen Description   Final    BLOOD LEFT HAND Performed at [facility], [address], [place], [place] [address]    Special Requests   Final    BOTTLES DRAWN AEROBIC AND ANAEROBIC Blood Culture adequate volume Performed at [facility], [address] [place], [place] [address]    Culture   Final    NO GROWTH 5 DAYS Performed at [facility], [address] [place], [place] [address]    Report Status 11/16/2018 FINAL  Final  Culture, blood (routine x 2)     Status: None   Collection Time: 11/11/18  4:25 PM   Specimen: BLOOD RIGHT HAND  Result Value Ref Range Status   Specimen Description   Final    BLOOD RIGHT HAND Performed at [facility], [address], [place], [place] [address]    Special Requests   Final    BOTTLES DRAWN AEROBIC AND ANAEROBIC Blood Culture adequate volume Performed at [facility], [address] [place], [place] [address]    Culture   Final    NO GROWTH 5 DAYS Performed at [facility], [address] [place], [place] [address]    Report Status 11/16/2018 FINAL  Final  Surgical pcr screen     Status: Abnormal   Collection Time: 11/12/18 11:46 PM   Specimen: Nasal Mucosa; Nasal Swab  Result Value Ref Range Status   MRSA, PCR NEGATIVE NEGATIVE Final   Staphylococcus aureus POSITIVE (A) NEGATIVE Final    Comment: (NOTE) The Xpert SA Assay (FDA approved for NASAL specimens in patients 39 years of age and older), is one component of a comprehensive surveillance program. It is not intended to diagnose infection nor to guide or monitor treatment. Performed at [facility], [address], [place], [place] [address]   Aerobic/Anaerobic Culture (surgical/deep wound)     Status: None (Preliminary result)   Collection Time: 11/13/18 11:07 AM    Specimen: Synovium  Result Value Ref Range Status   Specimen Description   Final    SYNOVIAL LEFT KNEE Performed at [facility], [address], [place], [place] [address]    Special Requests   Final    NONE Performed at [facility], [address] [place], [place] [address]    Gram Stain   Final  ABUNDANT WBC PRESENT, PREDOMINANTLY PMN FEW GRAM POSITIVE COCCI IN PAIRS Performed at [facility], [address] [place], [place] [address]    Culture   Final    MODERATE STAPHYLOCOCCUS AUREUS NO ANAEROBES ISOLATED; CULTURE IN PROGRESS FOR 5 DAYS    Report Status PENDING  Incomplete   Organism ID, Bacteria STAPHYLOCOCCUS AUREUS  Final      Susceptibility   Staphylococcus aureus - MIC*    CIPROFLOXACIN 4 RESISTANT Resistant     ERYTHROMYCIN <=0.25 SENSITIVE Sensitive     GENTAMICIN <=0.5 SENSITIVE Sensitive     OXACILLIN 0.5 SENSITIVE Sensitive     TETRACYCLINE <=1 SENSITIVE Sensitive     VANCOMYCIN <=0.5 SENSITIVE Sensitive     TRIMETH/SULFA <=10 SENSITIVE Sensitive     CLINDAMYCIN <=0.25 SENSITIVE Sensitive     RIFAMPIN <=0.5 SENSITIVE Sensitive     Inducible Clindamycin NEGATIVE Sensitive     * MODERATE STAPHYLOCOCCUS AUREUS         Radiology Studies: No results found.      Scheduled Meds: . Chlorhexidine Gluconate Cloth  6 each Topical Daily  . docusate sodium  100 mg Oral BID  . enoxaparin (LOVENOX) injection  40 mg Subcutaneous Daily  . feeding supplement (ENSURE ENLIVE)  237 mL Oral BID BM  . ferrous sulfate  325 mg Oral BID WC  . gabapentin  400 mg Oral TID  . insulin aspart  0-15 Units Subcutaneous TID WC  . insulin glargine  32 Units Subcutaneous QHS  . lip balm      . lisinopril  5 mg Oral Daily  . mupirocin ointment  1 application Nasal BID  . polyethylene glycol  17 g Oral BID  . senna-docusate  2 tablet Oral Daily  . sodium chloride flush  3 mL Intravenous Q12H  . traMADol  50 mg Oral Q6H  .  vitamin B-12  1,000 mcg Oral Daily   Continuous Infusions: . sodium chloride    . sodium chloride 20 mL/hr at 11/15/18 0032  .  ceFAZolin (ANCEF) IV 2 g (11/17/18 1304)     LOS: 7 days        Kimberly ModyVijaya Skii Cleland, MD [facility] Pager [phone]  If 7PM-7AM, please contact night-coverage [email or web address] Password [email or web address] 11/17/2018, 2:09 PM

## 2018-11-17 NOTE — TOC Progression Note (Addendum)
Transition of Care [facility]) - Progression Note    Patient Details  Name: Tiari Andringa MRN: [id number] Date of Birth: 1940-02-27  Transition of Care [facility]) CM/SW [place], [place] Phone Number: 11/17/2018, 3:10 PM  Clinical Narrative:    CSW followed up with the Admission Coordinator Christy at [facility], [place], [place]. [facility] faxed requested documents and now waiting for confirmation for SNF bed. COVID-19 test is pending. CSW updated spouse at bedside and notified him if the SNF is unable to extend a bed offer he will have to choose a different SNF. He reports understanding. Spouse is hopeful the patient will have a bed since she has been the SNF five times in the past for rehab.  TOC staff will continue to follow this patient.    Expected Discharge Plan: Skilled Nursing Facility Barriers to Discharge: Other (comment), Barriers Unresolved (comment)(COVID-19 test pending/ Waiting for [facility] to respond if bed availble for the patient)  Expected Discharge Plan and Services Expected Discharge Plan: [place] In-house Referral: Clinical Social Work   Post Acute Care Choice: Museum/gallery conservator, Home Health                                         Social Determinants of Health (SDOH) Interventions    Readmission Risk Interventions No flowsheet data found.

## 2018-11-17 NOTE — Plan of Care (Signed)
Continue with current POC

## 2018-11-17 NOTE — Progress Notes (Signed)
Patient ID: Kimberly Miller, female   DOB: 1940-12-09, 78 y.o.   MRN: [id number] Subjective: 4 Days Post-Op Procedure(s) (LRB): EXCISIONAL TOTAL KNEE ARTHROPLASTY WITH ANTIBIOTIC SPACERS (Left)    Patient reports pain as moderate.  Objective:   VITALS:   Vitals:   11/16/18 2148 11/17/18 0603  BP: (!) 146/83 (!) 124/110  Pulse: 96 94  Resp: 20 14  Temp: 98.9 F (37.2 C) 98.9 F (37.2 C)  SpO2: 98% 91%    Neurovascular intact Incision: scant drainage on dressing Pain with mobility of LLE  LABS Recent Labs    11/15/18 0331 11/16/18 0319 11/17/18 0412  HGB 9.3* 8.4* 9.3*  HCT 29.6* 26.4* 29.5*  WBC 8.5 8.6 7.6  PLT 158 217 255    Recent Labs    11/15/18 0331 11/16/18 0319 11/17/18 0412  NA 134* 136 134*  K 4.2 3.8 3.8  BUN 14 15 13   CREATININE 0.64 0.65 0.61  GLUCOSE 262* 152* 104*    No results for input(s): LABPT, INR in the last 72 hours.   Assessment/Plan: 4 Days Post-Op Procedure(s) (LRB): EXCISIONAL TOTAL KNEE ARTHROPLASTY WITH ANTIBIOTIC SPACERS (Left) MSSA cultured from left knee  Up with therapy Continue ABX therapy due to MSSA infected left total knee Likely SNF at discharge for rehab prior to discharge home IV antibiotics for 6 weeks RTC in 1 week from discharge

## 2018-11-17 NOTE — Progress Notes (Signed)
Covid Swab Performed. Patient up in chair.

## 2018-11-17 NOTE — Progress Notes (Signed)
Physical Therapy Treatment Patient Details Name: Kimberly Miller MRN: [id number] DOB: Jul 07, 1940 Today's Date: 11/17/2018    History of Present Illness Patient is 78 y.o. female s/p Lt TKA resection with antibiotic spacer placment on 11/13/18 with PMH significant for stroke, CAD, DM, and OA.    PT Comments    Patient received in bed, husband present and continue to verbally encourage her to participate in PT however states she did not receive therapy Saturday or Sunday- educated she did receive PT Saturday, unsure why she was not seen Sunday and apologized. She continues to display excessive amounts of anxiety regarding mobility, often tensing and crying out before PT even touches or when therapist lightly touches her. Attempted PROM in bed, patient physically resisting and unable to even get enough ROM to estimate motion available. Required MaxA for rolling for placement of maximove pad, with patient again physically resisting and often saying "wait!". Performed totalA transfer to chair with maximove, patient extremely anxious and crying throughout session. Attempted PROM knee flexion once in chair, patient yelling out and tensing but therapist able to gain potentially 30-35 degrees knee flexion. She was positioned to comfort with all needs met, chair alarm active and RN aware of use of maximove for back to bed later in day.     Follow Up Recommendations  SNF     Equipment Recommendations  None recommended by PT    Recommendations for Other Services       Precautions / Restrictions Precautions Precautions: Fall Restrictions Weight Bearing Restrictions: Yes LLE Weight Bearing: Partial weight bearing LLE Partial Weight Bearing Percentage or Pounds: 50%    Mobility  Bed Mobility Overal bed mobility: Needs Assistance Bed Mobility: Rolling Rolling: Max assist         General bed mobility comments: MaxA and Max VC/TC for rolling side to side for lift pad to be placed. Very anxious and  crying/yelling, physically resisting. Attempted L knee PROM but patient physically resisted.  Transfers Overall transfer level: Needs assistance     Sit to Stand: Total assist;+2 physical assistance;+2 safety/equipment         General transfer comment: totalA transfer to chair using maximove, patient yelling and crying the whole time, L LE supported by therapist to avoid excessive painful bending  Ambulation/Gait             General Gait Details: unable   Stairs             Wheelchair Mobility    Modified Rankin (Stroke Patients Only)       Balance Overall balance assessment: Needs assistance Sitting-balance support: Bilateral upper extremity supported;Feet supported Sitting balance-Leahy Scale: Fair     Standing balance support: Bilateral upper extremity supported;During functional activity Standing balance-Leahy Scale: Zero Standing balance comment: completely reliant on support from PT and tech to maintain upright                            Cognition Arousal/Alertness: Awake/alert Behavior During Therapy: Anxious;Flat affect Overall Cognitive Status: Impaired/Different from baseline Area of Impairment: Attention;Memory;Following commands;Safety/judgement;Awareness;Problem solving                 Orientation Level: Disoriented to;Situation Current Attention Level: Sustained Memory: Decreased recall of precautions;Decreased short-term memory Following Commands: Follows one step commands inconsistently;Follows one step commands with increased time Safety/Judgement: Decreased awareness of safety Awareness: Intellectual Problem Solving: Slow processing;Decreased initiation;Difficulty sequencing;Requires verbal cues;Requires tactile cues General Comments: often states "stop, we  can't just do things like zooooommm!" but then stops and looks off into distance needing VC to respond to PT. Very anxious. Physically resisting even more.       Exercises      General Comments        Pertinent Vitals/Pain Pain Assessment: Faces Faces Pain Scale: Hurts worst Pain Location: Lt knee Pain Descriptors / Indicators: Guarding;Sore;Crying;Moaning Pain Intervention(s): Limited activity within patient's tolerance;Monitored during session;Repositioned;Relaxation    Home Living                      Prior Function            PT Goals (current goals can now be found in the care plan section) Acute Rehab PT Goals Patient Stated Goal: to improve strength and walking PT Goal Formulation: With patient Time For Goal Achievement: 11/20/18 Potential to Achieve Goals: Fair Progress towards PT goals: Not progressing toward goals - comment(limited by pain and cognition)    Frequency    7X/week      PT Plan Current plan remains appropriate    Co-evaluation              AM-PAC PT "6 Clicks" Mobility   Outcome Measure  Help needed turning from your back to your side while in a flat bed without using bedrails?: A Lot Help needed moving from lying on your back to sitting on the side of a flat bed without using bedrails?: Total Help needed moving to and from a bed to a chair (including a wheelchair)?: Total Help needed standing up from a chair using your arms (e.g., wheelchair or bedside chair)?: Total Help needed to walk in hospital room?: Total Help needed climbing 3-5 steps with a railing? : Total 6 Click Score: 7    End of Session   Activity Tolerance: Patient limited by pain Patient left: with call bell/phone within reach;with chair alarm set;in chair Nurse Communication: Mobility status;Need for lift equipment PT Visit Diagnosis: Muscle weakness (generalized) (M62.81);Difficulty in walking, not elsewhere classified (R26.2);Pain Pain - Right/Left: Left Pain - part of body: Knee     Time: 7579-7282 PT Time Calculation (min) (ACUTE ONLY): 25 min  Charges:  $Therapeutic Activity: 23-37 mins                      Windell Norfolk, DPT, CBIS  Supplemental Physical Therapist [place]    Pager [phone] Acute Rehab Office [phone]

## 2018-11-18 DIAGNOSIS — R4189 Other symptoms and signs involving cognitive functions and awareness: Secondary | ICD-10-CM

## 2018-11-18 LAB — AEROBIC/ANAEROBIC CULTURE W GRAM STAIN (SURGICAL/DEEP WOUND)

## 2018-11-18 LAB — GLUCOSE, CAPILLARY
Glucose-Capillary: 116 mg/dL — ABNORMAL HIGH (ref 70–99)
Glucose-Capillary: 89 mg/dL (ref 70–99)
Glucose-Capillary: 135 mg/dL — ABNORMAL HIGH (ref 70–99)

## 2018-11-18 MED ORDER — POLYETHYLENE GLYCOL 3350 17 G PO PACK: 17.0000 g | PACK | Freq: Two times a day (BID) | ORAL | 0 refills | Status: AC

## 2018-11-18 MED ORDER — FOLIC ACID 1 MG PO TABS: 1.0000 mg | ORAL_TABLET | Freq: Every day | ORAL | 1 refills | Status: AC

## 2018-11-18 MED ORDER — HEPARIN SOD (PORK) LOCK FLUSH 100 UNIT/ML IV SOLN
250.0000 [IU] | INTRAVENOUS | Status: AC | PRN
  Administered 2018-11-18: 250 [IU]

## 2018-11-18 MED ORDER — DOCUSATE SODIUM 100 MG PO CAPS: 100.0000 mg | ORAL_CAPSULE | Freq: Two times a day (BID) | ORAL | 0 refills | Status: AC

## 2018-11-18 MED ORDER — CEFAZOLIN IV (FOR PTA / DISCHARGE USE ONLY): 2.0000 g | Freq: Three times a day (TID) | INTRAVENOUS | 0 refills | Status: AC

## 2018-11-18 MED ORDER — FERROUS SULFATE 325 (65 FE) MG PO TABS: 325.0000 mg | ORAL_TABLET | Freq: Two times a day (BID) | ORAL | 3 refills | Status: AC

## 2018-11-18 MED ORDER — ENSURE ENLIVE PO LIQD: 237.0000 mL | Freq: Two times a day (BID) | ORAL | 12 refills | Status: AC

## 2018-11-18 NOTE — Progress Notes (Signed)
RN to re enter iv team consult when antibiotics complete

## 2018-11-18 NOTE — TOC Transition Note (Signed)
Transition of Care [facility]) - CM/SW Discharge Note   Patient Details  Name: Kimberly Miller MRN: [id number] Date of Birth: [birth year]  Transition of Care [facility]) CM/SW Contact:  Lia Hopping, [place] Phone Number: 11/18/2018, 1:21 PM   Clinical Narrative:    Patient will discharge to: [facility] and Rehab Room Nurse call report to:[phone], Dial [phone] for operator and request to page "rehab 1" D/C Summary, FL2, COVID-19 results faxed.  PTAR to transport at 3:00PM     Final next level of care: [place] Barriers to Discharge: Barriers Resolved   Patient Goals and CMS Choice Patient states their goals for this hospitalization and ongoing recovery are:: To go to a SNF CMS Medicare.gov Compare Post Acute Care list provided to:: Patient Represenative (must comment) Choice offered to / list presented to : Spouse  Discharge Placement              Patient chooses bed at: [facility] Patient to be transferred to facility by: [facility] Name of family member notified: Spouse Patient and family notified of of transfer: 11/18/18  Discharge Plan and Services In-house Referral: Clinical Social Work   Post Acute Care Choice: Museum/gallery conservator, Home Health                               Social Determinants of Health (SDOH) Interventions     Readmission Risk Interventions No flowsheet data found.

## 2018-11-18 NOTE — NC FL2 (Addendum)
[place] MEDICAID FL2 LEVEL OF CARE SCREENING TOOL     IDENTIFICATION  Patient Name: Kimberly Miller Birthdate: [birth year] Sex: female Admission Date (Current Location): 11/10/2018  [facility] and [place] Number:  Producer, television/film/video and Address:  [facility],  [address] [place], [place] [address]      Provider Number: [id number]  Attending Physician Name and Address:  Kathlen Mody, MD  Relative Name and Phone Number:      [name], [name]  [phone], [phone]    Current Level of Care: Hospital Recommended Level of Care: Skilled Nursing Facility Prior Approval Number:    Date Approved/Denied:   PASRR Number:     Discharge Plan: SNF    Current Diagnoses: Patient Active Problem List   Diagnosis Date Noted  . Knee pain, left 11/11/2018  . Type 2 diabetes mellitus without complication, with long-term current use of insulin (HCC) 11/11/2018  . Cognitive impairment 11/11/2018  . ASVD (arteriosclerotic vascular disease) 11/11/2018  . Acute pain of left knee 11/11/2018  . Primary localized osteoarthritis of left knee 11/01/2015  . S/P total knee arthroplasty 11/01/2015    Orientation RESPIRATION BLADDER Height & Weight     Self,  Situation, Place   Normal Continent Weight: 185 lb (83.9 kg) Height:  5\' 10"  (177.8 cm)  BEHAVIORAL SYMPTOMS/MOOD NEUROLOGICAL BOWEL NUTRITION STATUS      Continent Diet(Low Sodium Heart Healthy)  AMBULATORY STATUS COMMUNICATION OF NEEDS Skin   Extensive Assist Verbally Surgical wounds(Left Leg)                       Personal Care Assistance Level of Assistance  Bathing, Feeding, Dressing Bathing Assistance: Maximum assistance Feeding assistance: Independent Dressing Assistance: Maximum assistance     Functional Limitations Info  Sight, Hearing, Speech Sight Info: Adequate Hearing Info: Adequate Speech Info: Adequate    SPECIAL CARE FACTORS FREQUENCY  PT (By licensed PT), OT (By licensed OT)      PT Frequency: 5X/WEEK OT Frequency: 5X/WEEK            Contractures Contractures Info: Not present    Additional Factors Info  Code Status, Allergies, Psychotropic, Insulin Sliding Scale Code Status Info: Fullcode Allergies Info: Allergies: Daypro ,Oxaprozin, Ibuprofen, Naproxen Sodium Psychotropic Info: Tramadol Insulin Sliding Scale Info: insulin aspart (novoLOG) injection 0-9 Units 3xs a day with meals, insulin glargine (LANTUS) injection 30 Units daily at bedtime       Current Medications (11/18/2018):  This is the current hospital active medication list Current Facility-Administered Medications  Medication Dose Route Frequency Provider Last Rate Last Dose  . 0.9 %  sodium chloride infusion  250 mL Intravenous PRN 13/10/2018, PA-C      . 0.9 %  sodium chloride infusion   Intravenous Continuous Lanney Gins, PA-C 20 mL/hr at 11/15/18 0032    . acetaminophen (TYLENOL) tablet 650 mg  650 mg Oral Q6H PRN 13/07/20, PA-C   650 mg at 11/12/18 2210   Or  . acetaminophen (TYLENOL) suppository 650 mg  650 mg Rectal Q6H PRN 13/04/20, PA-C      . alum & mag hydroxide-simeth (MAALOX/MYLANTA) 200-200-20 MG/5ML suspension 15 mL  15 mL Oral Q4H PRN Babish, Matthew, PA-C      . baclofen (LIORESAL) tablet 10 mg  10 mg Oral TID PRN 02-05-1978, PA-C      . bisacodyl (DULCOLAX) suppository 10 mg  10 mg Rectal Daily PRN Lanney Gins,  PA-C      . ceFAZolin (ANCEF) IVPB 2g/100 mL premix  2 g Intravenous Q8H BellMarcelino Duster, Michelle T, RPH 200 mL/hr at 11/18/18 0531 2 g at 11/18/18 0531  . diphenhydrAMINE (BENADRYL) 12.5 MG/5ML elixir 12.5-25 mg  12.5-25 mg Oral Q4H PRN Lanney GinsBabish, Matthew, PA-C      . docusate sodium (COLACE) capsule 100 mg  100 mg Oral BID Lanney GinsBabish, Matthew, PA-C   100 mg at 11/18/18 [id number]  . enoxaparin (LOVENOX) injection 40 mg  40 mg Subcutaneous Daily Andrez GrimeBissell, Jaclyn M, PA-C   40 mg at 11/18/18 [id number]  . feeding supplement (ENSURE ENLIVE) (ENSURE ENLIVE) liquid 237 mL   237 mL Oral BID BM Lanney GinsBabish, Matthew, PA-C   237 mL at 11/17/18 1414  . ferrous sulfate tablet 325 mg  325 mg Oral BID WC Lanney GinsBabish, Matthew, PA-C   325 mg at 11/18/18 [id number]  . folic acid (FOLVITE) tablet 1 mg  1 mg Oral Daily Kathlen ModyAkula, Vijaya, MD   1 mg at 11/18/18 0939  . gabapentin (NEURONTIN) capsule 400 mg  400 mg Oral TID Lanney GinsBabish, Matthew, PA-C   400 mg at 11/18/18 [id number]  . HYDROmorphone (DILAUDID) injection 0.5-1 mg  0.5-1 mg Intravenous Q2H PRN Babish, Matthew, PA-C      . insulin aspart (novoLOG) injection 0-9 Units  0-9 Units Subcutaneous TID WC Kathlen ModyAkula, Vijaya, MD      . insulin glargine (LANTUS) injection 30 Units  30 Units Subcutaneous QHS Kathlen ModyAkula, Vijaya, MD   30 Units at 11/17/18 2312  . lisinopril (ZESTRIL) tablet 5 mg  5 mg Oral Daily Lanney GinsBabish, Matthew, PA-C   5 mg at 11/18/18 [id number]  . magnesium citrate solution 1 Bottle  1 Bottle Oral Once PRN Babish, Matthew, PA-C      . menthol-cetylpyridinium (CEPACOL) lozenge 3 mg  1 lozenge Oral PRN Lanney GinsBabish, Matthew, PA-C       Or  . phenol (CHLORASEPTIC) mouth spray 1 spray  1 spray Mouth/Throat PRN Babish, Matthew, PA-C      . metoCLOPramide (REGLAN) tablet 5-10 mg  5-10 mg Oral Q8H PRN Lanney GinsBabish, Matthew, PA-C       Or  . metoCLOPramide (REGLAN) injection 5-10 mg  5-10 mg Intravenous Q8H PRN Babish, Matthew, PA-C      . ondansetron (ZOFRAN) tablet 4 mg  4 mg Oral Q6H PRN Lanney GinsBabish, Matthew, PA-C       Or  . ondansetron [facility](ZOFRAN) injection 4 mg  4 mg Intravenous Q6H PRN Lanney GinsBabish, Matthew, PA-C      . oxyCODONE (Oxy IR/ROXICODONE) immediate release tablet 5-10 mg  5-10 mg Oral Q4H PRN Lanney GinsBabish, Matthew, PA-C   10 mg at 11/17/18 1009  . polyethylene glycol (MIRALAX / GLYCOLAX) packet 17 g  17 g Oral BID Lanney GinsBabish, Matthew, PA-C   17 g at 11/17/18 2312  . senna-docusate (Senokot-S) tablet 2 tablet  2 tablet Oral Daily Lanney GinsBabish, Matthew, PA-C   2 tablet at 11/18/18 [id number]  . sodium chloride flush (NS) 0.9 % injection 10-40 mL  10-40 mL Intracatheter PRN Kathlen ModyAkula, Vijaya, MD       . sodium chloride flush (NS) 0.9 % injection 3 mL  3 mL Intravenous Q12H Lanney GinsBabish, Matthew, PA-C   3 mL at 11/14/18 2330  . sodium chloride flush (NS) 0.9 % injection 3 mL  3 mL Intravenous PRN Babish, Matthew, PA-C      . traMADol Janean Sark(ULTRAM) tablet 50 mg  50 mg Oral Q6H Babish, Molli HazardMatthew, PA-C   50 mg at 11/18/18 1242  .  traZODone (DESYREL) tablet 50 mg  50 mg Oral QHS PRN Danae Orleans, PA-C      . vitamin B-12 (CYANOCOBALAMIN) tablet 1,000 mcg  1,000 mcg Oral Daily Danae Orleans, PA-C   1,000 mcg at 11/18/18 9211     Discharge Medications: Please see discharge summary for a list of discharge medications.  Relevant Imaging Results:  Relevant Lab Results:   Additional Information [id number]    Lia Hopping, LCSW

## 2018-11-18 NOTE — Discharge Summary (Addendum)
Physician Discharge Summary  Kimberly Miller ZOX:[id number] DOB: September 29, 1940 DOA: 11/10/2018  PCP: Josem Kaufmann, MD  Admit date: 11/10/2018 Discharge date: 11/18/2018  Admitted From: Home.  Disposition:  SNF  Recommendations for Outpatient Follow-up:  1. Follow up with PCP in 1-2 weeks 2. Please obtain BMP/CBC in one week Please follow up with Orthopedics as recommended.  Please follow up with ID as recommended.    Discharge Condition:stable.  CODE STATUS: FULL CODE.  Diet recommendation: Heart Healthy   Brief/Interim Summary: 78 year old lady with prior history of left total knee replacement, diabetes mellitus, CVA, history of coronary artery disease was admitted to the hospital for left knee pain and swelling.  X-rays of the knee does not show any fracture, it was followed with a CT showing effusion.  She was seen by orthopedic surgeon and underwent aspiration of the knee on 11/3.  She was scheduled for removal of possible infected hardware.  Patient underwent removal of the infected hardware in total knee arthroplasty with antibiotic spacers on the left on 11/13/2018 by Dr. Alvan Dame.  Cultures from the knee aspirate grew staph species and patient is currently on vancomycin and Rocephin.  ID consulted by orthopedics and recommended to continue with vancomycin and Rocephin. Plan for 6 weeks of IV therapy as per ID. PT eval recommending SNF. CSW consulted. PICC line placed. OPAT orders placed.   Discharge Diagnoses:  Active Problems:   Knee pain, left   Type 2 diabetes mellitus without complication, with long-term current use of insulin (HCC)   Cognitive impairment   ASVD (arteriosclerotic vascular disease)   Acute pain of left knee  Left knee pain/possible septic arthritis Orthopedics consulted and underwent removal of infected hardware and total knee arthroplasty by orthopedics on 11/13/18. Culture from the knee aspirate showed MSSA .  She was initially started on IV vancomycin and Ancef  later on transition to IV Ancef for 6 weeks of therapy as per ID recommendations.  PT evaluation recommending SNF.  Clinical social worker on board to assist with transition to rehab on discharge. Pain controlled with tramadol.  Patient reports her pain is controlled with tramadol but continues to have pain on range of movements of the knee.  Insulin-dependent diabetes mellitus  CBG (last 3)  Recent Labs    11/17/18 1810 11/17/18 2120 11/18/18 0720  GLUCAP 193* 194* 116*    Decrease the Lantus to 30 units and continue with sliding scale insulin.    Anemia of blood loss from surgery superimposed on mild anemia of chronic disease  Baseline hemoglobin around 11, hemoglobin dropped from 11-9.3 probably secondary to blood loss from surgery.  Anemia panel showed low folate levels and low iron levels supplementation will be added.     Hypertension:  Well controlled.    Mild cognitive impairment Patient appears to be at baseline without any issues.  Constipation Added  Colace and MiraLAX to her regimen.     Discharge Instructions  Discharge Instructions    Diet - low sodium heart healthy   Complete by: As directed    Discharge instructions   Complete by: As directed    PLEASE follow up with orthopedics as recommended.  Please follow up with PCP in one week.   Home infusion instructions [facility] May follow [place] Dosing Protocol; May administer Cathflo as needed to maintain patency of vascular access device.; Flushing of vascular access device: per [facility] Protocol: 0.9% NaCl pre/post medica...   Complete by: As directed    Instructions:  May follow [place] Dosing Protocol   Instructions: May administer Cathflo as needed to maintain patency of vascular access device.   Instructions: Flushing of vascular access device: per [facility] Protocol: 0.9% NaCl pre/post medication administration and prn patency; Heparin 100 u/ml, 60m for implanted ports and  Heparin 10u/ml, 524mfor all other central venous catheters.   Instructions: May follow [facility] Anaphylaxis Protocol for First Dose Administration in the home: 0.9% NaCl at 25-50 ml/hr to maintain IV access for protocol meds. Epinephrine 0.3 ml IV/IM PRN and Benadryl 25-50 IV/IM PRN s/s of anaphylaxis.   Instructions: [facility] Coordinator (RN) to assist per patient IV care needs in the home PRN.     Allergies as of 11/18/2018      Reactions   Daypro [oxaprozin] Swelling, Rash   Face, mouth, tongue swelling   Ibuprofen Swelling, Rash   Allergic to ALL anti-inflammatory medications, including excedrine, aleve   Naproxen Sodium Swelling, Rash      Medication List    STOP taking these medications   HYDROcodone-acetaminophen 5-325 MG tablet Commonly known as: NORCO/VICODIN     TAKE these medications   B-12 5000 MCG Caps Take 1 tablet by mouth daily.   baclofen 10 MG tablet Commonly known as: LIORESAL Take 1 tablet (10 mg total) by mouth 3 (three) times daily. As needed for muscle spasm What changed:   when to take this  reasons to take this  additional instructions   ceFAZolin  IVPB Commonly known as: ANCEF Inject 2 g into the vein every 8 (eight) hours. Indication:  Left septic knee TKR Last Day of Therapy:  12/24/2018 Labs - Once weekly:  CBC/D and BMP, Labs - Every other week:  ESR and CRP   CVS D3 125 MCG (5000 UT) capsule Generic drug: Cholecalciferol Take 5,000 Units by mouth daily.   docusate sodium 100 MG capsule Commonly known as: COLACE Take 1 capsule (100 mg total) by mouth 2 (two) times daily.   feeding supplement (ENSURE ENLIVE) Liqd Take 237 mLs by mouth 2 (two) times daily between meals.   ferrous sulfate 325 (65 FE) MG tablet Take 1 tablet (325 mg total) by mouth 2 (two) times daily with a meal.   folic acid 1 MG tablet Commonly known as: FOLVITE Take 1 tablet (1 mg total) by mouth daily. Start taking on: November 19, 2018    gabapentin 400 MG capsule Commonly known as: NEURONTIN Take 400 mg by mouth 3 (three) times daily.   insulin glargine 100 UNIT/ML injection Commonly known as: LANTUS Inject 30 Units into the skin at bedtime.   lisinopril 5 MG tablet Commonly known as: ZESTRIL Take 5 mg by mouth daily.   OVER THE COUNTER MEDICATION Take 1 tablet by mouth daily. B Complex vitamin with Vitamin C and folic acid   polyethylene glycol 17 g packet Commonly known as: MIRALAX / GLYCOLAX Take 17 g by mouth 2 (two) times daily.   sennosides-docusate sodium 8.6-50 MG tablet Commonly known as: SENOKOT-S Take 2 tablets by mouth daily.   traZODone 50 MG tablet Commonly known as: DESYREL Take 50 mg by mouth at bedtime as needed for sleep.            Home Infusion Instuctions  (From admission, onward)         Start     Ordered   11/18/18 0000  Home infusion instructions [facility] May follow [facility] Protocol; May administer Cathflo as needed to maintain  patency of vascular access device.; Flushing of vascular access device: per [facility] Protocol: 0.9% NaCl pre/post medica...    Question Answer Comment  Instructions May follow [place] Dosing Protocol   Instructions May administer Cathflo as needed to maintain patency of vascular access device.   Instructions Flushing of vascular access device: per [facility] Protocol: 0.9% NaCl pre/post medication administration and prn patency; Heparin 100 u/ml, 24m for implanted ports and Heparin 10u/ml, 553mfor all other central venous catheters.   Instructions May follow [facility] Anaphylaxis Protocol for First Dose Administration in the home: 0.9% NaCl at 25-50 ml/hr to maintain IV access for protocol meds. Epinephrine 0.3 ml IV/IM PRN and Benadryl 25-50 IV/IM PRN s/s of anaphylaxis.   Instructions [facility] Infusion Coordinator (RN) to assist per patient IV care needs in the home PRN.      11/18/18 1225         Follow-up Information    OlParalee CancelMD. Schedule an appointment as soon as possible for a visit in 1 week(s).   Specialty: Orthopedic Surgery Why: wound check Contact information: [phone]      SeJosem KaufmannMD. Schedule an appointment as soon as possible for a visit in 1 week(s).   Specialty: Family Medicine Contact information: [address] [place] [phone]        Allergies  Allergen Reactions  . Daypro [Oxaprozin] Swelling and Rash    Face, mouth, tongue swelling  . Ibuprofen Swelling and Rash    Allergic to ALL anti-inflammatory medications, including excedrine, aleve  . Naproxen Sodium Swelling and Rash    Consultations:  Orthopedics.    Procedures/Studies: Dg Knee 1-2 Views Left  Result Date: 11/12/2018 CLINICAL DATA:  [age]ear old female with pain redness and warmth at the left knee. Recent joint aspiration. EXAM: LEFT KNEE - 1-2 VIEW COMPARISON:  08/27/2016. FINDINGS: Chronic left knee arthroplasty. Small to moderate joint effusion on the cross-table lateral view, with trace non dependent gas which is probably related to recent needle aspiration. Hardware appears intact with stable alignment. No acute osseous abnormality identified. IMPRESSION: 1. Small to moderate joint effusion with trace non dependent gas, likely from recent needle aspiration. 2. Stable total knee hardware. No acute osseous abnormality identified. Electronically Signed   By: H Genevie Ann.D.   On: 11/12/2018 09:43   Nm Bone Scan 3 Phase Lower Extremity  Result Date: 11/11/2018 CLINICAL DATA:  Severe LEFT knee pain for 3 days, prior LEFT total knee arthroplasty 11/01/2015, question septic LEFT knee EXAM: NUCLEAR MEDICINE 3-PHASE BONE SCAN TECHNIQUE: Radionuclide angiographic images, immediate static blood pool images, and 3-hour delayed static images were obtained of the knees after intravenous injection of radiopharmaceutical. RADIOPHARMACEUTICALS:   21.0 mCi Tc-9956mP IV COMPARISON:  None Correlation: LEFT knee radiographs 11/01/2015 FINDINGS: Vascular phase: Increased blood flow to the periarticular regions of the LEFT knee. Normal blood flow to RIGHT knee. Blood pool phase: Increased blood pool surrounding the LEFT knee joint. Normal blood pool at RIGHT knee. Delayed phase: Patient was unstable, unable to return to Nuclear Medicine Department for delayed imaging. No delayed images were obtained. IMPRESSION: Increased blood flow and blood pool of tracer surrounding LEFT knee joint consistent with hyperemia, could be due to synovitis though infection is not excluded. The lack of delayed imaging makes this exam nondiagnostic for the presence of aseptic loosening or infection of the LEFT knee prosthesis. Electronically Signed  By: Lavonia Dana M.D.   On: 11/11/2018 16:23   [place] Ekg Site Rite  Result Date: 11/13/2018 If Site Rite image not attached, placement could not be confirmed due to current cardiac rhythm.      Subjective: No new complaints today.   Discharge Exam: Vitals:   11/17/18 2121 11/18/18 0529  BP: 125/88 133/65  Pulse: 99 91  Resp: 14 14  Temp: 98.2 F (36.8 C) 98.3 F (36.8 C)  SpO2: 97% 95%   Vitals:   11/17/18 0952 11/17/18 1517 11/17/18 2121 11/18/18 0529  BP: 138/63 (!) 113/53 125/88 133/65  Pulse: 84 (!) 103 99 91  Resp: '16 16 14 14  '$ Temp: 99 F (37.2 C) 98.8 F (37.1 C) 98.2 F (36.8 C) 98.3 F (36.8 C)  TempSrc: Oral Oral Oral Oral  SpO2: 97% 94% 97% 95%  Weight:      Height:        General: Pt is alert, awake, not in acute distress Cardiovascular: RRR, S1/S2 +, no rubs, no gallops Respiratory: CTA bilaterally, no wheezing, no rhonchi Abdominal: Soft, NT, ND, bowel sounds + Extremities: trace edema.     The results of significant diagnostics from this hospitalization (including imaging, microbiology, ancillary and laboratory) are listed below for reference.     Microbiology: Recent Results  (from the past 240 hour(s))  SARS CORONAVIRUS 2 (TAT 6-24 HRS) Nasopharyngeal Nasopharyngeal Swab     Status: None   Collection Time: 11/10/18 10:32 PM   Specimen: Nasopharyngeal Swab  Result Value Ref Range Status   SARS Coronavirus 2 NEGATIVE NEGATIVE Final    Comment: (NOTE) SARS-CoV-2 target nucleic acids are NOT DETECTED. The SARS-CoV-2 RNA is generally detectable in upper and lower respiratory specimens during the acute phase of infection. Negative results do not preclude SARS-CoV-2 infection, do not rule out co-infections with other pathogens, and should not be used as the sole basis for treatment or other patient management decisions. Negative results must be combined with clinical observations, patient history, and epidemiological information. The expected result is Negative. Fact Sheet for Patients: SugarRoll.be Fact Sheet for Healthcare Providers: https://www.woods-mathews.com/ This test is not yet approved or cleared by the [place] FDA and  has been authorized for detection and/or diagnosis of SARS-CoV-2 by FDA under an Emergency Use Authorization (EUA). This EUA will remain  in effect (meaning this test can be used) for the duration of the COVID-19 declaration under Section 56 4(b)(1) of the Act, 21 U.S.C. section 360bbb-3(b)(1), unless the authorization is terminated or revoked sooner. Performed at [facility], [place] [address], [place], [place] [address]   Culture, blood (routine x 2)     Status: None   Collection Time: 11/11/18  4:21 PM   Specimen: BLOOD LEFT HAND  Result Value Ref Range Status   Specimen Description   Final    BLOOD LEFT HAND Performed at [facility] [address] [place], [place] [address]    Special Requests   Final    BOTTLES DRAWN AEROBIC AND ANAEROBIC Blood Culture adequate volume Performed at [facility] [address], [place], [place]  [address]    Culture   Final    NO GROWTH 5 DAYS Performed at [facility], [place] [address], [place], [place] [address]    Report Status 11/16/2018 FINAL  Final  Culture, blood (routine x 2)     Status: None   Collection Time: 11/11/18  4:25 PM   Specimen: BLOOD  RIGHT HAND  Result Value Ref Range Status   Specimen Description   Final    BLOOD RIGHT HAND Performed at [facility] [address] [place], [place] [address]    Special Requests   Final    BOTTLES DRAWN AEROBIC AND ANAEROBIC Blood Culture adequate volume Performed at [facility] [address], [place], [place] [address]    Culture   Final    NO GROWTH 5 DAYS Performed at [facility], [place] [address], [place], [place] [address]    Report Status 11/16/2018 FINAL  Final  Surgical pcr screen     Status: Abnormal   Collection Time: 11/12/18 11:46 PM   Specimen: Nasal Mucosa; Nasal Swab  Result Value Ref Range Status   MRSA, PCR NEGATIVE NEGATIVE Final   Staphylococcus aureus POSITIVE (A) NEGATIVE Final    Comment: (NOTE) The Xpert SA Assay (FDA approved for NASAL specimens in patients 22 years of age and older), is one component of a comprehensive surveillance program. It is not intended to diagnose infection nor to guide or monitor treatment. Performed at [facility], [place] [address], [place], [place] [address]   Aerobic/Anaerobic Culture (surgical/deep wound)     Status: None   Collection Time: 11/13/18 11:07 AM   Specimen: Synovium  Result Value Ref Range Status   Specimen Description   Final    SYNOVIAL LEFT KNEE Performed at [facility] [address] [place], [place] [address]    Special Requests   Final    NONE Performed at [facility], [place] [address] [place], [place] [address]    Gram Stain   Final    ABUNDANT WBC PRESENT, PREDOMINANTLY PMN FEW GRAM POSITIVE COCCI IN PAIRS    Culture    Final    MODERATE STAPHYLOCOCCUS AUREUS NO ANAEROBES ISOLATED Performed at [facility], [place] [address] [place], [place] [address]    Report Status 11/18/2018 FINAL  Final   Organism ID, Bacteria STAPHYLOCOCCUS AUREUS  Final      Susceptibility   Staphylococcus aureus - MIC*    CIPROFLOXACIN 4 RESISTANT Resistant     ERYTHROMYCIN <=0.25 SENSITIVE Sensitive     GENTAMICIN <=0.5 SENSITIVE Sensitive     OXACILLIN 0.5 SENSITIVE Sensitive     TETRACYCLINE <=1 SENSITIVE Sensitive     VANCOMYCIN <=0.5 SENSITIVE Sensitive     TRIMETH/SULFA <=10 SENSITIVE Sensitive     CLINDAMYCIN <=0.25 SENSITIVE Sensitive     RIFAMPIN <=0.5 SENSITIVE Sensitive     Inducible Clindamycin NEGATIVE Sensitive     * MODERATE STAPHYLOCOCCUS AUREUS  SARS CORONAVIRUS 2 (TAT 6-24 HRS) Nasopharyngeal Nasopharyngeal Swab     Status: None   Collection Time: 11/17/18 10:34 AM   Specimen: Nasopharyngeal Swab  Result Value Ref Range Status   SARS Coronavirus 2 NEGATIVE NEGATIVE Final    Comment: (NOTE) SARS-CoV-2 target nucleic acids are NOT DETECTED. The SARS-CoV-2 RNA is generally detectable in upper and lower respiratory specimens during the acute phase of infection. Negative results do not preclude SARS-CoV-2 infection, do not rule out co-infections with other pathogens, and should not be used as the sole basis for treatment or other patient management decisions. Negative results must be combined with clinical observations, patient history, and epidemiological information. The expected result is Negative. Fact Sheet for Patients: SugarRoll.be Fact Sheet for Healthcare Providers: https://www.woods-mathews.com/ This test is not yet approved or cleared by the [place] FDA and  has been authorized for detection and/or diagnosis of SARS-CoV-2 by FDA under an Emergency Use Authorization (EUA). This EUA will remain  in effect (meaning this test can be used) for the  duration of the COVID-19 declaration under Section 56 4(b)(1) of the Act, 21 U.S.C. section 360bbb-3(b)(1), unless the authorization is terminated or revoked sooner. Performed at [facility], [place] [address], [place], [place] [address]      Labs: BNP (last 3 results) No results for input(s): BNP in the last 8760 hours. Basic Metabolic Panel: Recent Labs  Lab 11/13/18 0538 11/14/18 0240 11/15/18 0331 11/16/18 0319 11/17/18 0412  NA 134* 134* 134* 136 134*  K 4.4 4.3 4.2 3.8 3.8  CL 100 101 100 100 97*  CO2 '25 24 25 27 30  '$ GLUCOSE 265* 234* 262* 152* 104*  BUN '13 12 14 15 13  '$ CREATININE 0.89 0.89 0.64 0.65 0.61  CALCIUM 8.4* 8.0* 8.4* 8.4* 8.2*   Liver Function Tests: No results for input(s): AST, ALT, ALKPHOS, BILITOT, PROT, ALBUMIN in the last 168 hours. No results for input(s): LIPASE, AMYLASE in the last 168 hours. No results for input(s): AMMONIA in the last 168 hours. CBC: Recent Labs  Lab 11/13/18 0538 11/14/18 0240 11/15/18 0331 11/16/18 0319 11/17/18 0412  WBC 10.0 7.6 8.5 8.6 7.6  HGB 11.1* 9.8* 9.3* 8.4* 9.3*  HCT 35.7* 32.0* 29.6* 26.4* 29.5*  MCV 97.0 98.5 95.5 94.6 95.2  PLT 198 171 158 217 255   Cardiac Enzymes: No results for input(s): CKTOTAL, CKMB, CKMBINDEX, TROPONINI in the last 168 hours. BNP: Invalid input(s): POCBNP CBG: Recent Labs  Lab 11/17/18 0719 11/17/18 1117 11/17/18 1810 11/17/18 2120 11/18/18 0720  GLUCAP 78 99 193* 194* 116*   D-Dimer No results for input(s): DDIMER in the last 72 hours. Hgb A1c No results for input(s): HGBA1C in the last 72 hours. Lipid Profile No results for input(s): CHOL, HDL, LDLCALC, TRIG, CHOLHDL, LDLDIRECT in the last 72 hours. Thyroid function studies No results for input(s): TSH, T4TOTAL, T3FREE, THYROIDAB in the last 72 hours.  Invalid input(s): FREET3 Anemia work up Recent Labs    11/16/18 0319 11/16/18 1430  VITAMINB12  --  706  FOLATE  --  3.7*  FERRITIN  --  222  TIBC   --  203*  IRON  --  24*  RETICCTPCT 1.4  --    Urinalysis No results found for: COLORURINE, APPEARANCEUR, LABSPEC, [place], GLUCOSEU, HGBUR, BILIRUBINUR, KETONESUR, PROTEINUR, UROBILINOGEN, NITRITE, LEUKOCYTESUR Sepsis Labs Invalid input(s): PROCALCITONIN,  WBC,  LACTICIDVEN Microbiology Recent Results (from the past 240 hour(s))  SARS CORONAVIRUS 2 (TAT 6-24 HRS) Nasopharyngeal Nasopharyngeal Swab     Status: None   Collection Time: 11/10/18 10:32 PM   Specimen: Nasopharyngeal Swab  Result Value Ref Range Status   SARS Coronavirus 2 NEGATIVE NEGATIVE Final    Comment: (NOTE) SARS-CoV-2 target nucleic acids are NOT DETECTED. The SARS-CoV-2 RNA is generally detectable in upper and lower respiratory specimens during the acute phase of infection. Negative results do not preclude SARS-CoV-2 infection, do not rule out co-infections with other pathogens, and should not be used as the sole basis for treatment or other patient management decisions. Negative results must be combined with clinical observations, patient history, and epidemiological information. The expected result is Negative. Fact Sheet for Patients: SugarRoll.be Fact Sheet for Healthcare Providers: https://www.woods-mathews.com/ This test is not yet approved or cleared by the [place] FDA and  has been authorized for detection and/or diagnosis of SARS-CoV-2  by FDA under an Emergency Use Authorization (EUA). This EUA will remain  in effect (meaning this test can be used) for the duration of the COVID-19 declaration under Section 56 4(b)(1) of the Act, 21 U.S.C. section 360bbb-3(b)(1), unless the authorization is terminated or revoked sooner. Performed at [facility], [place] [address] [place], [place] [address]   Culture, blood (routine x 2)     Status: None   Collection Time: 11/11/18  4:21 PM   Specimen: BLOOD LEFT HAND  Result Value Ref Range Status   Specimen  Description   Final    BLOOD LEFT HAND Performed at [facility] [address], [place], [place] [address]    Special Requests   Final    BOTTLES DRAWN AEROBIC AND ANAEROBIC Blood Culture adequate volume Performed at [facility] [address] [place], [place] [address]    Culture   Final    NO GROWTH 5 DAYS Performed at [facility], [place] [address] [place], [place] [address]    Report Status 11/16/2018 FINAL  Final  Culture, blood (routine x 2)     Status: None   Collection Time: 11/11/18  4:25 PM   Specimen: BLOOD RIGHT HAND  Result Value Ref Range Status   Specimen Description   Final    BLOOD RIGHT HAND Performed at [facility] [address], [place], [place] [address]    Special Requests   Final    BOTTLES DRAWN AEROBIC AND ANAEROBIC Blood Culture adequate volume Performed at [facility] [address] [place], [place] [address]    Culture   Final    NO GROWTH 5 DAYS Performed at [facility], [place] [address], [place], [place] [address]    Report Status 11/16/2018 FINAL  Final  Surgical pcr screen     Status: Abnormal   Collection Time: 11/12/18 11:46 PM   Specimen: Nasal Mucosa; Nasal Swab  Result Value Ref Range Status   MRSA, PCR NEGATIVE NEGATIVE Final   Staphylococcus aureus POSITIVE (A) NEGATIVE Final    Comment: (NOTE) The Xpert SA Assay (FDA approved for NASAL specimens in patients 75 years of age and older), is one component of a comprehensive surveillance program. It is not intended to diagnose infection nor to guide or monitor treatment. Performed at [facility], [place] [address] [place], [place] [address]   Aerobic/Anaerobic Culture (surgical/deep wound)     Status: None   Collection Time: 11/13/18 11:07 AM   Specimen: Synovium  Result Value Ref Range Status   Specimen Description   Final    SYNOVIAL LEFT KNEE Performed at [facility] [address] [place], [place] [address]    Special Requests   Final    NONE Performed at [facility], [place] [address], [place], [place] [address]    Gram Stain   Final    ABUNDANT WBC PRESENT, PREDOMINANTLY PMN FEW GRAM POSITIVE COCCI IN PAIRS    Culture   Final    MODERATE STAPHYLOCOCCUS AUREUS NO ANAEROBES ISOLATED Performed at [facility], [place] [address], [place], [place] [address]    Report Status 11/18/2018 FINAL  Final   Organism ID, Bacteria STAPHYLOCOCCUS AUREUS  Final      Susceptibility   Staphylococcus aureus - MIC*    CIPROFLOXACIN 4 RESISTANT Resistant     ERYTHROMYCIN <=0.25 SENSITIVE Sensitive     GENTAMICIN <=0.5  SENSITIVE Sensitive     OXACILLIN 0.5 SENSITIVE Sensitive     TETRACYCLINE <=1 SENSITIVE Sensitive     VANCOMYCIN <=0.5 SENSITIVE Sensitive     TRIMETH/SULFA <=10 SENSITIVE Sensitive     CLINDAMYCIN <=0.25 SENSITIVE Sensitive     RIFAMPIN <=0.5 SENSITIVE Sensitive     Inducible Clindamycin NEGATIVE Sensitive     * MODERATE STAPHYLOCOCCUS AUREUS  SARS CORONAVIRUS 2 (TAT 6-24 HRS) Nasopharyngeal Nasopharyngeal Swab     Status: None   Collection Time: 11/17/18 10:34 AM   Specimen: Nasopharyngeal Swab  Result Value Ref Range Status   SARS Coronavirus 2 NEGATIVE NEGATIVE Final    Comment: (NOTE) SARS-CoV-2 target nucleic acids are NOT DETECTED. The SARS-CoV-2 RNA is generally detectable in upper and lower respiratory specimens during the acute phase of infection. Negative results do not preclude SARS-CoV-2 infection, do not rule out co-infections with other pathogens, and should not be used as the sole basis for treatment or other patient management decisions. Negative results must be combined with clinical observations, patient history, and epidemiological information. The expected result is Negative. Fact Sheet for Patients: SugarRoll.be Fact Sheet for Healthcare  Providers: https://www.woods-mathews.com/ This test is not yet approved or cleared by the [place] FDA and  has been authorized for detection and/or diagnosis of SARS-CoV-2 by FDA under an Emergency Use Authorization (EUA). This EUA will remain  in effect (meaning this test can be used) for the duration of the COVID-19 declaration under Section 56 4(b)(1) of the Act, 21 U.S.C. section 360bbb-3(b)(1), unless the authorization is terminated or revoked sooner. Performed at [facility], [place] [address] [place], [place] [address]      Time coordinating discharge: 35 minutes  SIGNED:   Hosie Poisson, MD  [facility] 11/18/2018, 12:26 PM

## 2018-11-18 NOTE — TOC Progression Note (Signed)
Transition of Care [facility]) - Progression Note    Patient Details  Name: Kimberly Miller MRN: [id number] Date of Birth: 16-Nov-1940  Transition of Care [facility]) CM/SW [place], [place] Phone Number: 11/18/2018, 8:28 AM  Clinical Narrative:    Patient accepted to [facility] in [place], [place]. Patient COVID-19 test negative. No discharge barriers at this time.  Spouse updated.    Expected Discharge Plan: Skilled Nursing Facility Barriers to Discharge: Barriers Resolved  Expected Discharge Plan and Services Expected Discharge Plan: [place] In-house Referral: Clinical Social Work   Post Acute Care Choice: Durable Medical Equipment, Home Health                                         Social Determinants of Health (SDOH) Interventions    Readmission Risk Interventions No flowsheet data found.

## 2018-11-18 NOTE — Progress Notes (Signed)
Physical Therapy Treatment Patient Details Name: Kimberly Miller MRN: [id number] DOB: [birth year] Today's Date: 11/18/2018    History of Present Illness Patient is 78 y.o. female s/p Lt TKA resection with antibiotic spacer placment on 11/13/18 with PMH significant for stroke, CAD, DM, and OA.    PT Comments    Patient received in bed, pleasant but perseverating on getting coffee and breakfast. Focused today's session on functional exercises as patient has not tolerated performing both these and bed mobility/OOB activities, has not received knee specific exercises/ROM since Friday. Able to perform ankle pumps, quad sets, hip ADD/ABD, SLRs, and gastroc stretches successfully and with improved effort by patient today, however when attempting heel slides and knee flexion PROM patient physically resisted via hip extension and quad activation, making it impossible for therapist to move L LE. Performed neuromuscular techniques and passive IR/ER of L LE to promote relaxation, but patient continued to physically resist and guard all motions making it impossible to continue with interventions. Unable to measure or estimate ROM today due to patient physically resisting. Extensive education provided regarding importance of PT and working on ROM to prevent contracture/prevent making these activities even more difficult in the future, with no teach back or signs of understanding of education given this morning. She was left in bed with all needs met, bed alarm active.     Follow Up Recommendations  SNF     Equipment Recommendations  None recommended by PT    Recommendations for Other Services       Precautions / Restrictions Precautions Precautions: Fall Restrictions Weight Bearing Restrictions: Yes LLE Weight Bearing: Partial weight bearing LLE Partial Weight Bearing Percentage or Pounds: 50%    Mobility  Bed Mobility               General bed mobility comments: focus on functional exercises  and ROM today  Transfers                 General transfer comment: focus on functional exercises and ROM today  Ambulation/Gait             General Gait Details: unable   Stairs             Wheelchair Mobility    Modified Rankin (Stroke Patients Only)       Balance Overall balance assessment: Needs assistance Sitting-balance support: Bilateral upper extremity supported;Feet supported Sitting balance-Leahy Scale: Fair     Standing balance support: Bilateral upper extremity supported;During functional activity Standing balance-Leahy Scale: Zero Standing balance comment: completely reliant on support from PT and tech to maintain upright                            Cognition Arousal/Alertness: Awake/alert Behavior During Therapy: Anxious;Flat affect Overall Cognitive Status: Impaired/Different from baseline Area of Impairment: Attention;Memory;Following commands;Safety/judgement;Awareness;Problem solving                 Orientation Level: Disoriented to;Situation Current Attention Level: Sustained Memory: Decreased recall of precautions;Decreased short-term memory Following Commands: Follows one step commands inconsistently;Follows one step commands with increased time Safety/Judgement: Decreased awareness of safety Awareness: Intellectual Problem Solving: Slow processing;Decreased initiation;Difficulty sequencing;Requires verbal cues;Requires tactile cues General Comments: continues with low level of attention and low level of focus, had to close door and turn off tv to eliminate all distractions for full participation. Remains very anxious and resisting.      Exercises      General Comments  Pertinent Vitals/Pain Pain Assessment: 0-10 Pain Score: 10-Worst pain ever Pain Location: Lt knee Pain Descriptors / Indicators: Guarding;Sore;Crying;Moaning Pain Intervention(s): Limited activity within patient's  tolerance;Monitored during session    Home Living                      Prior Function            PT Goals (current goals can now be found in the care plan section) Acute Rehab PT Goals Patient Stated Goal: to improve strength and walking PT Goal Formulation: With patient Time For Goal Achievement: 11/20/18 Potential to Achieve Goals: Fair Progress towards PT goals: Not progressing toward goals - comment(limited by pain and cognition)    Frequency    7X/week      PT Plan Current plan remains appropriate    Co-evaluation              AM-PAC PT "6 Clicks" Mobility   Outcome Measure  Help needed turning from your back to your side while in a flat bed without using bedrails?: A Lot Help needed moving from lying on your back to sitting on the side of a flat bed without using bedrails?: Total Help needed moving to and from a bed to a chair (including a wheelchair)?: Total Help needed standing up from a chair using your arms (e.g., wheelchair or bedside chair)?: Total Help needed to walk in hospital room?: Total Help needed climbing 3-5 steps with a railing? : Total 6 Click Score: 7    End of Session   Activity Tolerance: Patient limited by pain;Other (comment)(also limited by cognition) Patient left: in bed;with call bell/phone within reach;with bed alarm set   PT Visit Diagnosis: Muscle weakness (generalized) (M62.81);Difficulty in walking, not elsewhere classified (R26.2);Pain Pain - Right/Left: Left Pain - part of body: Knee     Time: 0379-5583 PT Time Calculation (min) (ACUTE ONLY): 25 min  Charges:  $Therapeutic Exercise: 8-22 mins $Self Care/Home Management: 8-22                     Windell Norfolk, DPT, CBIS  Supplemental Physical Therapist [place]    Pager [phone] Acute Rehab Office [phone]

## 2018-12-23 ENCOUNTER — Other Ambulatory Visit: Payer: Self-pay

## 2018-12-23 ENCOUNTER — Encounter: Payer: Self-pay | Admitting: Infectious Disease

## 2018-12-23 ENCOUNTER — Ambulatory Visit (INDEPENDENT_AMBULATORY_CARE_PROVIDER_SITE_OTHER): Payer: Medicare Other | Admitting: Infectious Disease

## 2018-12-23 DIAGNOSIS — T8450XD Infection and inflammatory reaction due to unspecified internal joint prosthesis, subsequent encounter: Secondary | ICD-10-CM

## 2018-12-23 DIAGNOSIS — Z96659 Presence of unspecified artificial knee joint: Secondary | ICD-10-CM | POA: Diagnosis present

## 2018-12-23 DIAGNOSIS — A4901 Methicillin susceptible Staphylococcus aureus infection, unspecified site: Secondary | ICD-10-CM | POA: Diagnosis not present

## 2018-12-23 DIAGNOSIS — T8450XA Infection and inflammatory reaction due to unspecified internal joint prosthesis, initial encounter: Secondary | ICD-10-CM

## 2018-12-23 HISTORY — DX: Infection and inflammatory reaction due to unspecified internal joint prosthesis, initial encounter: T84.50XA

## 2018-12-23 HISTORY — DX: Methicillin susceptible Staphylococcus aureus infection, unspecified site: A49.01

## 2018-12-23 MED ORDER — CEPHALEXIN 500 MG PO CAPS: 500.0000 mg | ORAL_CAPSULE | Freq: Four times a day (QID) | ORAL | 1 refills | Status: AC

## 2018-12-23 NOTE — Progress Notes (Signed)
Subjective:    Patient ID: Kimberly Miller, female    DOB: [birth year], 78 y.o.   MRN: [id number]  HPI  78 y.o. 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  She is currently on cefazolin 2 g IV every 8 hours and due to finish the antibiotics tomorrow.  She is receiving antibiotics through peripheral IV in her left arm.  Patient today she is not able to very well remember when her hospitalization was and claims that her knee is still in significant pain.  Cardiac cannot clearly get out of her whether her knee pain is now better than it was when he was in that she was in the hospital or worse.  When I examined her she still did have some tenderness in the left knee and some swelling around the incision site.    Past Medical History:  Diagnosis Date  . Arthritis   . Coronary artery disease   . Diabetes mellitus without complication ([place])   . Headache   . MSSA (methicillin susceptible Staphylococcus aureus) infection 12/23/2018  . Myocardial infarction ([place])   . Primary localized osteoarthritis of left knee 11/01/2015  . 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 Stroke [facility])     Past Surgical History:  Procedure Laterality Date  . BACK SURGERY     reports 7 procedures  . CARDIAC CATHETERIZATION  2016  . COLONOSCOPY    . EXCISIONAL TOTAL KNEE ARTHROPLASTY WITH ANTIBIOTIC SPACERS Left 11/13/2018   Procedure: EXCISIONAL TOTAL KNEE ARTHROPLASTY WITH ANTIBIOTIC SPACERS;  Surgeon: Paralee Cancel, MD;  Location: [facility];  Service: Orthopedics;  Laterality: Left;  . TOTAL KNEE ARTHROPLASTY Left 11/01/2015   Procedure: TOTAL KNEE ARTHROPLASTY;  Surgeon: Marchia Bond, MD;  Location: [place];  Service: Orthopedics;  Laterality: Left;  . WRIST SURGERY Left     Family History  Problem Relation Age of Onset  . Stroke Mother   . Stroke Father   . Diabetes Brother   . Dementia Sister       Social History   Socioeconomic History  .  Marital status: Unknown    Spouse name: Not on file  . Number of children: Not on file  . Years of education: Not on file  . Highest education level: Not on file  Occupational History  . Not on file  Tobacco Use  . Smoking status: Never Smoker  . Smokeless tobacco: Never Used  Substance and Sexual Activity  . Alcohol use: No  . Drug use: No  . Sexual activity: Not on file  Other Topics Concern  . Not on file  Social History Narrative  . Not on file   Social Determinants of Health   Financial Resource Strain:   . Difficulty of Paying Living Expenses: Not on file  Food Insecurity:   . Worried About Charity fundraiser in the Last Year: Not on file  . Ran Out of Food in the Last Year: Not on file  Transportation Needs:   . Lack of Transportation (Medical): Not on file  . Lack of Transportation (Non-Medical): Not on file  Physical Activity:   . Days of Exercise per Week: Not on file  . Minutes of Exercise per Session: Not on file  Stress:   . Feeling of Stress : Not on file  Social Connections:   . Frequency of Communication with Friends and Family: Not on file  . Frequency of Social Gatherings with Friends and Family:  Not on file  . Attends Religious Services: Not on file  . Active Member of Clubs or Organizations: Not on file  . Attends Archivist Meetings: Not on file  . Marital Status: Not on file    Allergies  Allergen Reactions  . Daypro [Oxaprozin] Swelling and Rash    Face, mouth, tongue swelling  . Ibuprofen Swelling and Rash    Allergic to ALL anti-inflammatory medications, including excedrine, aleve  . Naproxen Sodium Swelling and Rash     Current Outpatient Medications:  .  baclofen (LIORESAL) 10 MG tablet, Take 1 tablet (10 mg total) by mouth 3 (three) times daily. As needed for muscle spasm (Patient taking differently: Take 10 mg by mouth 3 (three) times daily as needed for muscle spasms. ), Disp: 50 tablet, Rfl: 0 .  ceFAZolin (ANCEF) IVPB,  Inject 2 g into the vein every 8 (eight) hours. Indication:  Left septic knee TKR Last Day of Therapy:  12/24/2018 Labs - Once weekly:  CBC/D and BMP, Labs - Every other week:  ESR and CRP, Disp: 114 Units, Rfl: 0 .  cephALEXin (KEFLEX) 500 MG capsule, Take 1 capsule (500 mg total) by mouth 4 (four) times daily., Disp: 120 capsule, Rfl: 1 .  Cholecalciferol (CVS D3) 5000 units capsule, Take 5,000 Units by mouth daily., Disp: , Rfl:  .  Cyanocobalamin (B-12) 5000 MCG CAPS, Take 1 tablet by mouth daily. , Disp: , Rfl:  .  docusate sodium (COLACE) 100 MG capsule, Take 1 capsule (100 mg total) by mouth 2 (two) times daily., Disp: 10 capsule, Rfl: 0 .  feeding supplement, ENSURE ENLIVE, (ENSURE ENLIVE) LIQD, Take 237 mLs by mouth 2 (two) times daily between meals., Disp: 237 mL, Rfl: 12 .  ferrous sulfate 325 (65 FE) MG tablet, Take 1 tablet (325 mg total) by mouth 2 (two) times daily with a meal., Disp: 90 tablet, Rfl: 3 .  folic acid (FOLVITE) 1 MG tablet, Take 1 tablet (1 mg total) by mouth daily., Disp: 30 tablet, Rfl: 1 .  gabapentin (NEURONTIN) 400 MG capsule, Take 400 mg by mouth 3 (three) times daily., Disp: , Rfl:  .  insulin glargine (LANTUS) 100 UNIT/ML injection, Inject 30 Units into the skin at bedtime., Disp: , Rfl:  .  lisinopril (PRINIVIL,ZESTRIL) 5 MG tablet, Take 5 mg by mouth daily., Disp: , Rfl:  .  OVER THE COUNTER MEDICATION, Take 1 tablet by mouth daily. B Complex vitamin with Vitamin C and folic acid, Disp: , Rfl:  .  polyethylene glycol (MIRALAX / GLYCOLAX) 17 g packet, Take 17 g by mouth 2 (two) times daily., Disp: 14 each, Rfl: 0 .  sennosides-docusate sodium (SENOKOT-S) 8.6-50 MG tablet, Take 2 tablets by mouth daily. (Patient not taking: Reported on 11/10/2018), Disp: 30 tablet, Rfl: 1 .  traZODone (DESYREL) 50 MG tablet, Take 50 mg by mouth at bedtime as needed for sleep. , Disp: , Rfl:     Review of Systems  Unable to perform ROS: Dementia       Objective:   Physical  Exam Constitutional:      General: She is not in acute distress.    Appearance: Normal appearance. She is well-developed. She is not ill-appearing or diaphoretic.  HENT:     Head: Normocephalic and atraumatic.     Right Ear: Hearing and external ear normal.     Left Ear: Hearing and external ear normal.     Nose: No nasal deformity or rhinorrhea.  Eyes:  General: No scleral icterus.    Conjunctiva/sclera: Conjunctivae normal.     Right eye: Right conjunctiva is not injected.     Left eye: Left conjunctiva is not injected.     Pupils: Pupils are equal, round, and reactive to light.  Neck:     Vascular: No JVD.  Cardiovascular:     Rate and Rhythm: Normal rate and regular rhythm.     Heart sounds: Normal heart sounds, S1 normal and S2 normal. No murmur. No friction rub.  Pulmonary:     Effort: Pulmonary effort is normal. No respiratory distress.  Abdominal:     General: There is no distension.     Palpations: Abdomen is soft.  Musculoskeletal:     Right shoulder: Normal.     Left shoulder: Normal.     Cervical back: Normal range of motion and neck supple.     Right hip: Normal.     Left hip: Normal.     Right knee: Normal.     Left knee: Normal.       Legs:  Lymphadenopathy:     Head:     Right side of head: No submandibular, preauricular or posterior auricular adenopathy.     Left side of head: No submandibular, preauricular or posterior auricular adenopathy.     Cervical: No cervical adenopathy.     Right cervical: No superficial or deep cervical adenopathy.    Left cervical: No superficial or deep cervical adenopathy.  Skin:    General: Skin is warm and dry.     Coloration: Skin is not pale.     Findings: No abrasion, bruising, ecchymosis, erythema, lesion or rash.     Nails: There is no clubbing.  Neurological:     General: No focal deficit present.     Mental Status: She is alert and oriented to person, place, and time.     Sensory: No sensory deficit.      Coordination: Coordination normal.     Gait: Gait normal.  Psychiatric:        Attention and Perception: She is attentive.        Mood and Affect: Mood normal.        Speech: Speech normal.        Behavior: Behavior is cooperative.        Thought Content: Thought content normal.        Cognition and Memory: Memory is impaired. She exhibits impaired recent memory.        Judgment: Judgment normal.          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  I am bothered by the fact that she still feels a fair amount of pain at the site and will extend her antibiotics with Keflex 500 mg orally 4 times daily and see her in 1 month's time.

## 2019-01-26 ENCOUNTER — Encounter: Payer: Self-pay | Admitting: Infectious Disease

## 2019-01-26 ENCOUNTER — Ambulatory Visit (INDEPENDENT_AMBULATORY_CARE_PROVIDER_SITE_OTHER): Payer: Medicare Other | Admitting: Infectious Disease

## 2019-01-26 ENCOUNTER — Other Ambulatory Visit: Payer: Self-pay

## 2019-01-26 DIAGNOSIS — E119 Type 2 diabetes mellitus without complications: Secondary | ICD-10-CM

## 2019-01-26 DIAGNOSIS — I7090 Unspecified atherosclerosis: Secondary | ICD-10-CM | POA: Diagnosis not present

## 2019-01-26 DIAGNOSIS — M1712 Unilateral primary osteoarthritis, left knee: Secondary | ICD-10-CM | POA: Diagnosis not present

## 2019-01-26 DIAGNOSIS — Z794 Long term (current) use of insulin: Secondary | ICD-10-CM

## 2019-01-26 DIAGNOSIS — A4901 Methicillin susceptible Staphylococcus aureus infection, unspecified site: Secondary | ICD-10-CM

## 2019-01-26 DIAGNOSIS — Z96659 Presence of unspecified artificial knee joint: Secondary | ICD-10-CM | POA: Diagnosis not present

## 2019-01-26 DIAGNOSIS — T8450XD Infection and inflammatory reaction due to unspecified internal joint prosthesis, subsequent encounter: Secondary | ICD-10-CM | POA: Diagnosis present

## 2019-01-26 NOTE — Progress Notes (Signed)
Virtual Visit via Video Note  I connected with Kimberly Miller on 01/26/19 at  2:15 PM EST by a video enabled telemedicine application and verified that I am speaking with the correct person using two identifiers.  Location: Patient: [facility] Provider: [facility]   I discussed the limitations of evaluation and management by telemedicine and the availability of in person appointments. The patient expressed understanding and agreed to proceed.  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  She is currently on cefazolin 2 g IV every 8 hours which she completed.  When I saw her in the clinic in December she was still having significant pain and I was apprehensive about her being off antibiotics altogether and so I placed her on Keflex 500 mg 4 times daily.    Since then she has completed her antibiotics roughly a week ago.  Her knee pain is improved versus 1 month ago when I saw her.  She does not know that she has an appointment with Dr. Ihor Gully yet for follow-up.  She is scheduled get her Covid vaccination in second week of February.  Does not have nausea fever or other systemic symptoms and is doing well     Past Medical History:  Diagnosis Date  . Arthritis   . Coronary artery disease   . Diabetes mellitus without complication ([place])   . Headache   . MSSA (methicillin susceptible Staphylococcus aureus) infection 12/23/2018  . Myocardial infarction ([place])   . Primary localized osteoarthritis of left knee 11/01/2015  . 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 Stroke [facility])     Past Surgical History:  Procedure Laterality Date  . BACK SURGERY     reports 7 procedures  . CARDIAC CATHETERIZATION  2016  . COLONOSCOPY    . EXCISIONAL TOTAL KNEE ARTHROPLASTY WITH ANTIBIOTIC SPACERS Left 11/13/2018   Procedure: EXCISIONAL TOTAL KNEE ARTHROPLASTY WITH  ANTIBIOTIC SPACERS;  Surgeon: Paralee Cancel, MD;  [facility];  Service: Orthopedics;  Laterality: Left;  . TOTAL KNEE ARTHROPLASTY Left 11/01/2015   Procedure: TOTAL KNEE ARTHROPLASTY;  Surgeon: Marchia Bond, MD;  Location: [place];  Service: Orthopedics;  Laterality: Left;  . WRIST SURGERY Left     Family History  Problem Relation Age of Onset  . Stroke Mother   . Stroke Father   . Diabetes Brother   . Dementia Sister       Social History   Socioeconomic History  . Marital status: Unknown    Spouse name: Not on file  . Number of children: Not on file  . Years of education: Not on file  . Highest education level: Not on file  Occupational History  . Not on file  Tobacco Use  . Smoking status: Never Smoker  . Smokeless tobacco: Never Used  Substance and Sexual Activity  . Alcohol use: No  . Drug use: No  . Sexual activity: Not on file  Other Topics Concern  . Not on file  Social History Narrative  . Not on file   Social Determinants of Health   Financial Resource Strain:   . Difficulty of Paying Living Expenses: Not on file  Food Insecurity:   . Worried About Charity fundraiser in the Last Year: Not on file  . Ran Out of Food in the Last Year: Not on file  Transportation Needs:   . Lack of Transportation (Medical):  Not on file  . Lack of Transportation (Non-Medical): Not on file  Physical Activity:   . Days of Exercise per Week: Not on file  . Minutes of Exercise per Session: Not on file  Stress:   . Feeling of Stress : Not on file  Social Connections:   . Frequency of Communication with Friends and Family: Not on file  . Frequency of Social Gatherings with Friends and Family: Not on file  . Attends Religious Services: Not on file  . Active Member of Clubs or Organizations: Not on file  . Attends Banker Meetings: Not on file  . Marital Status: Not on file    Allergies  Allergen Reactions  . Daypro [Oxaprozin] Swelling and Rash     Face, mouth, tongue swelling  . Ibuprofen Swelling and Rash    Allergic to ALL anti-inflammatory medications, including excedrine, aleve  . Naproxen Sodium Swelling and Rash     Current Outpatient Medications:  .  baclofen (LIORESAL) 10 MG tablet, Take 1 tablet (10 mg total) by mouth 3 (three) times daily. As needed for muscle spasm (Patient taking differently: Take 10 mg by mouth 3 (three) times daily as needed for muscle spasms. ), Disp: 50 tablet, Rfl: 0 .  cephALEXin (KEFLEX) 500 MG capsule, Take 1 capsule (500 mg total) by mouth 4 (four) times daily., Disp: 120 capsule, Rfl: 1 .  Cholecalciferol (CVS D3) 5000 units capsule, Take 5,000 Units by mouth daily., Disp: , Rfl:  .  Cyanocobalamin (B-12) 5000 MCG CAPS, Take 1 tablet by mouth daily. , Disp: , Rfl:  .  docusate sodium (COLACE) 100 MG capsule, Take 1 capsule (100 mg total) by mouth 2 (two) times daily., Disp: 10 capsule, Rfl: 0 .  feeding supplement, ENSURE ENLIVE, (ENSURE ENLIVE) LIQD, Take 237 mLs by mouth 2 (two) times daily between meals., Disp: 237 mL, Rfl: 12 .  ferrous sulfate 325 (65 FE) MG tablet, Take 1 tablet (325 mg total) by mouth 2 (two) times daily with a meal., Disp: 90 tablet, Rfl: 3 .  folic acid (FOLVITE) 1 MG tablet, Take 1 tablet (1 mg total) by mouth daily., Disp: 30 tablet, Rfl: 1 .  gabapentin (NEURONTIN) 400 MG capsule, Take 400 mg by mouth 3 (three) times daily., Disp: , Rfl:  .  insulin glargine (LANTUS) 100 UNIT/ML injection, Inject 30 Units into the skin at bedtime., Disp: , Rfl:  .  lisinopril (PRINIVIL,ZESTRIL) 5 MG tablet, Take 5 mg by mouth daily., Disp: , Rfl:  .  OVER THE COUNTER MEDICATION, Take 1 tablet by mouth daily. B Complex vitamin with Vitamin C and folic acid, Disp: , Rfl:  .  polyethylene glycol (MIRALAX / GLYCOLAX) 17 g packet, Take 17 g by mouth 2 (two) times daily., Disp: 14 each, Rfl: 0 .  sennosides-docusate sodium (SENOKOT-S) 8.6-50 MG tablet, Take 2 tablets by mouth daily. (Patient  not taking: Reported on 11/10/2018), Disp: 30 tablet, Rfl: 1 .  traZODone (DESYREL) 50 MG tablet, Take 50 mg by mouth at bedtime as needed for sleep. , Disp: , Rfl:     ROS as in HPI and otherwise negative on 12 point ROS   Observations/Objective:  On video exam she is appears completely alert and oriented but at times hard of hearing.  She is pleasant and interactive.      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  She appears to be doing quite well clinically.  We will observe her off antibiotics and plan on seeing her in March.  She should make an appointment with her surgeon Dr. Constance Goltz but having her knee reimplanted is not an urgent matter and certainly should not be scheduled at this point in time with the height of the Covid pandemic.  This actually is good for the patient in the sense that it gives is greater certainty about the infection being eradicated the further without we are from her being on antibiotics.  When I see her in person I will check follow-up inflammatory markers  Follow Up Instructions:    I discussed the assessment and treatment plan with the patient. The patient was provided an opportunity to ask questions and all were answered. The patient agreed with the plan and demonstrated an understanding of the instructions.   The patient was advised to call back or seek an in-person evaluation if the symptoms worsen or if the condition fails to improve as anticipated.  I provided 25 minutes of non-face-to-face time during this encounter.   Acey Lav, MD

## 2019-04-01 ENCOUNTER — Ambulatory Visit: Payer: Medicare Other | Admitting: Infectious Disease

## 2020-07-05 IMAGING — NM NM BONE 3 PHASE
2 series · 12 of 12 positions shown · non-contrast
Comparison: None

Correlation: LEFT knee radiographs 11/01/2015

CLINICAL DATA: Severe LEFT knee pain for 3 days, prior LEFT total
knee arthroplasty 11/01/2015, question septic LEFT knee

EXAM:
NUCLEAR MEDICINE 3-PHASE BONE SCAN
TECHNIQUE: Radionuclide angiographic images, immediate static blood pool
images, and 3-hour delayed static images were obtained of the knees
after intravenous injection of radiopharmaceutical.
RADIOPHARMACEUTICALS:  21.0 mCi Rc-HHm MDP IV

[Series 1: flow · 4.14mm/px · 6 of 40 frames shown (1 of 2)]
[frame 4/40  full-range]
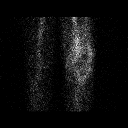
[frame 10/40  full-range]
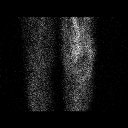
[frame 17/40  full-range]
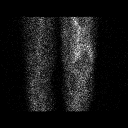
[frame 24/40  full-range]
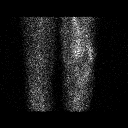
[frame 30/40  full-range]
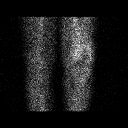
[frame 37/40  full-range]
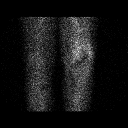

[Series 1: flow · 4.14mm/px · 6 of 40 frames shown (2 of 2)]
[frame 4/40  full-range]
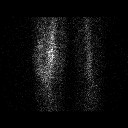
[frame 10/40  full-range]
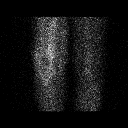
[frame 17/40  full-range]
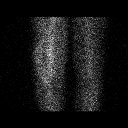
[frame 24/40  full-range]
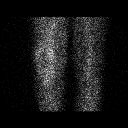
[frame 30/40  full-range]
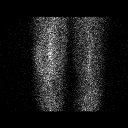
[frame 37/40  full-range]
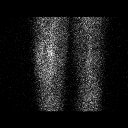

[12 of 12 positions shown; findings below may reference images not displayed]

FINDINGS: Vascular phase: Increased blood flow to the periarticular regions of
the LEFT knee. Normal blood flow to RIGHT knee.

Blood pool phase: Increased blood pool surrounding the LEFT knee
joint. Normal blood pool at RIGHT knee.

Delayed phase: Patient was unstable, unable to return to Nuclear
Medicine Department for delayed imaging. No delayed images were
obtained.
IMPRESSION: Increased blood flow and blood pool of tracer surrounding LEFT knee
joint consistent with hyperemia, could be due to synovitis though
infection is not excluded.

The lack of delayed imaging makes this exam nondiagnostic for the
presence of aseptic loosening or infection of the LEFT knee
prosthesis.

## 2020-07-06 IMAGING — DX DG KNEE 1-2V*L*
2 series · 2 of 2 positions shown · non-contrast
Comparison: 08/27/2016.

CLINICAL DATA: 78-year-old female with pain redness and warmth at
the left knee. Recent joint aspiration.

EXAM:
LEFT KNEE - 1-2 VIEW

[knee ap]
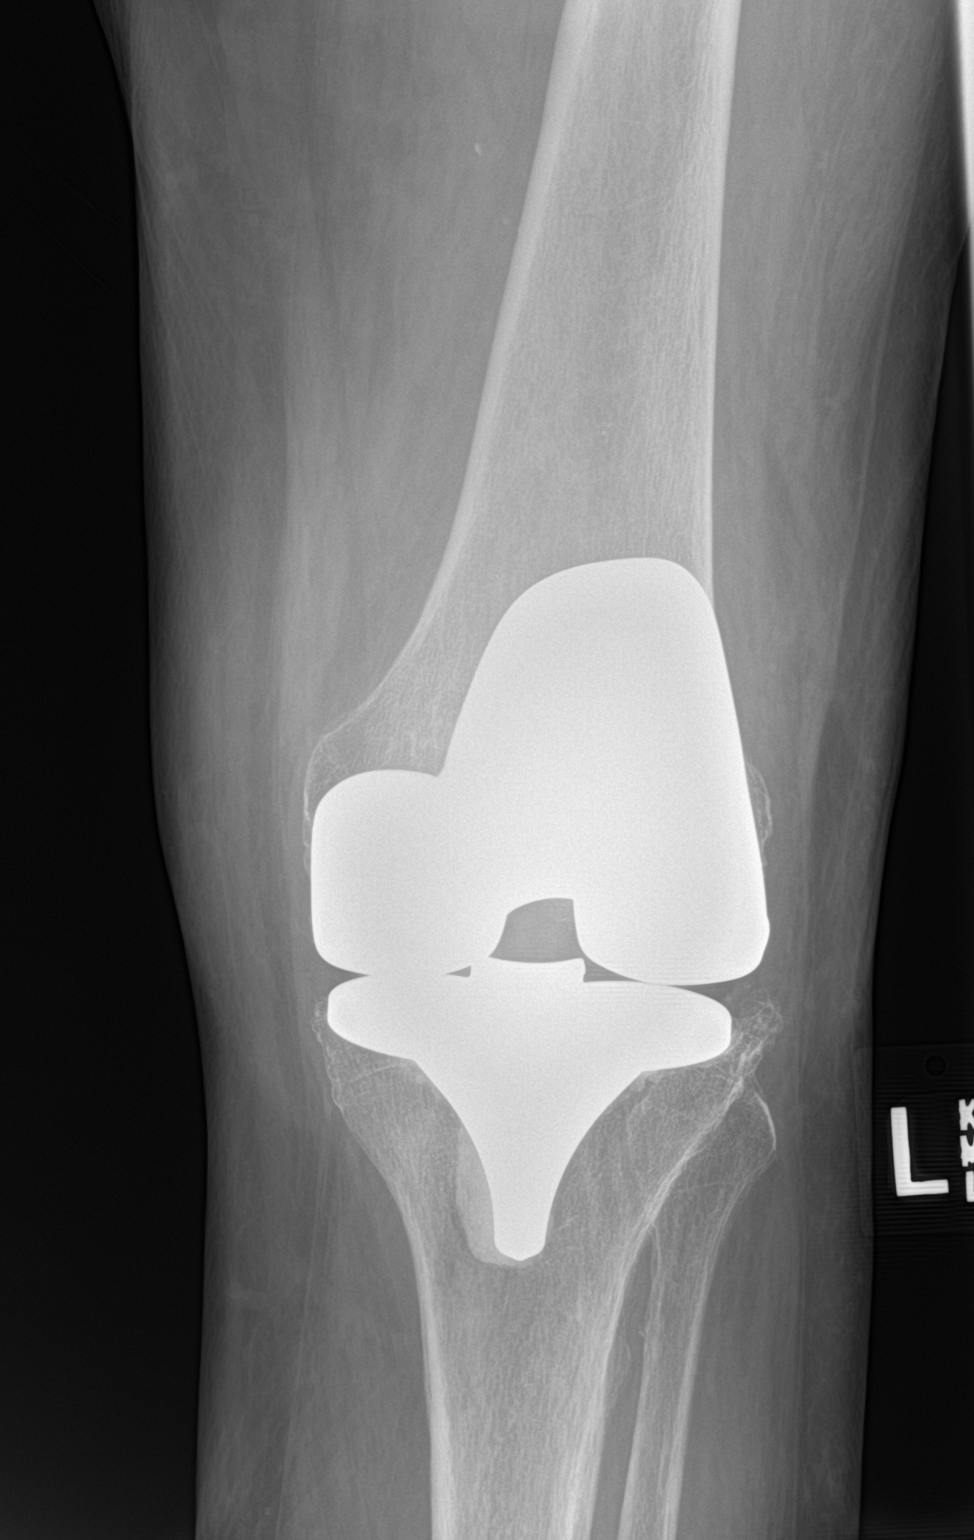

[knee lat]
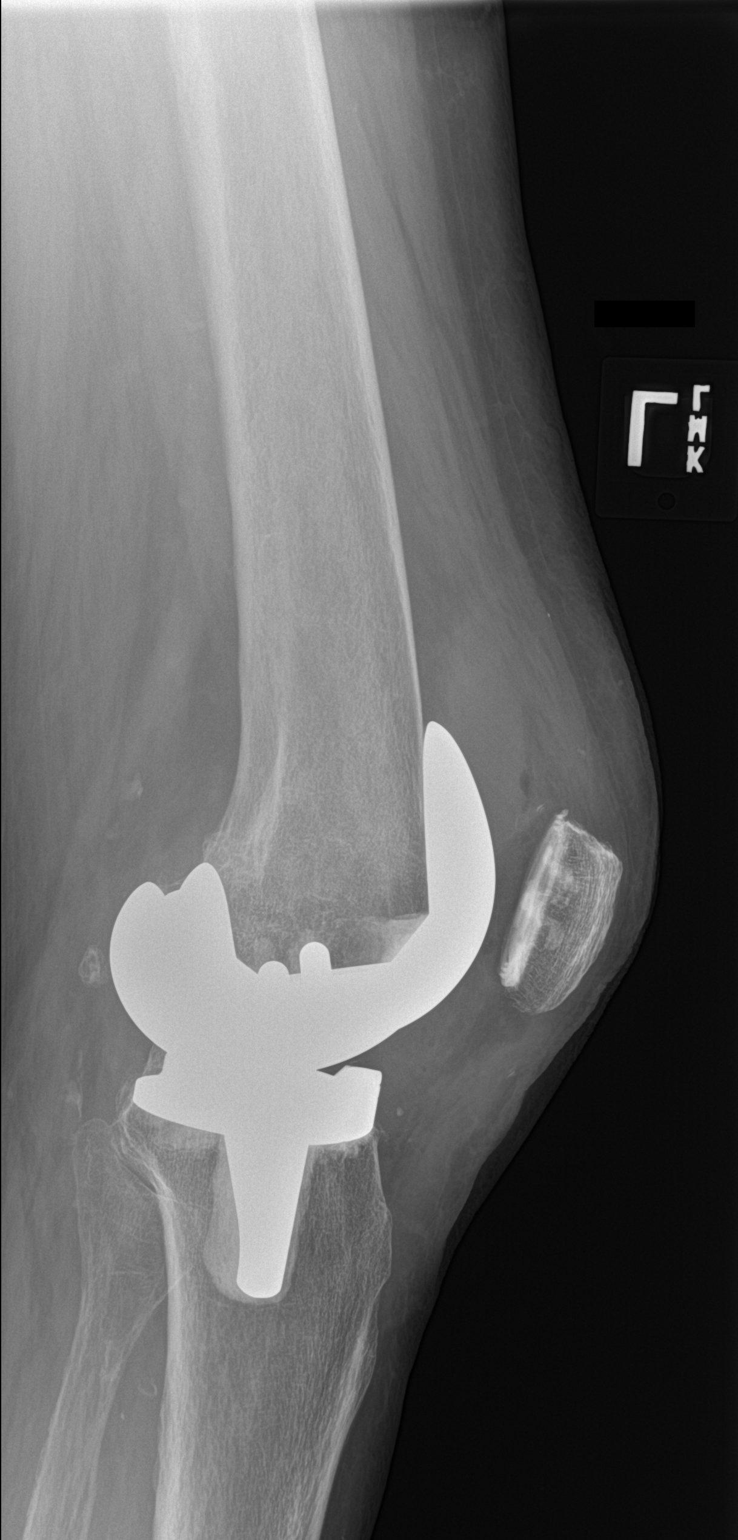

[2 of 2 positions shown; findings below may reference images not displayed]

FINDINGS: Chronic left knee arthroplasty. Small to moderate joint effusion on
the cross-table lateral view, with trace non dependent gas which is
probably related to recent needle aspiration. Hardware appears
intact with stable alignment. No acute osseous abnormality
identified.
IMPRESSION: 1. Small to moderate joint effusion with trace non dependent gas,
likely from recent needle aspiration.
2. Stable total knee hardware. No acute osseous abnormality
identified.

## 2023-01-09 DEATH — deceased
